# Patient Record
Sex: Male | Born: 1990 | Race: White | Hispanic: No | Marital: Single | Smoking: Never smoker
Health system: Southern US, Community
[De-identification: ages and names within clinical notes are randomized; demographics above are authoritative.]

## PROBLEM LIST (undated history)

## (undated) DIAGNOSIS — Z789 Other specified health status: Secondary | ICD-10-CM

## (undated) HISTORY — PX: NO PAST SURGERIES: SHX2092

---

## 2013-08-03 ENCOUNTER — Ambulatory Visit
Admission: RE | Admit: 2013-08-03 | Discharge: 2013-08-03 | Disposition: A | Discharge status: Home / Self Care | Payer: Self-pay | Source: Ambulatory Visit | Attending: Orthopedic Surgery | Admitting: Orthopedic Surgery

## 2013-08-03 ENCOUNTER — Other Ambulatory Visit: Payer: Self-pay | Admitting: Orthopedic Surgery

## 2013-08-03 ENCOUNTER — Inpatient Hospital Stay (HOSPITAL_COMMUNITY)
Admission: AD | Admit: 2013-08-03 | Discharge: 2013-08-13 | DRG: 853 | Disposition: A | Discharge status: Home Health | Payer: Managed Care, Other (non HMO) | Source: Ambulatory Visit | Attending: Internal Medicine | Admitting: Internal Medicine

## 2013-08-03 ENCOUNTER — Ambulatory Visit
Admission: RE | Admit: 2013-08-03 | Discharge: 2013-08-03 | Disposition: A | Discharge status: Home / Self Care | Payer: Managed Care, Other (non HMO) | Source: Ambulatory Visit | Attending: Orthopedic Surgery | Admitting: Orthopedic Surgery

## 2013-08-03 ENCOUNTER — Inpatient Hospital Stay (HOSPITAL_COMMUNITY): Payer: Managed Care, Other (non HMO)

## 2013-08-03 ENCOUNTER — Encounter (HOSPITAL_COMMUNITY): Payer: Self-pay | Admitting: Adult Health

## 2013-08-03 VITALS — Ht 70.0 in | Wt 155.6 lb

## 2013-08-03 DIAGNOSIS — R1900 Intra-abdominal and pelvic swelling, mass and lump, unspecified site: Secondary | ICD-10-CM

## 2013-08-03 DIAGNOSIS — R509 Fever, unspecified: Secondary | ICD-10-CM

## 2013-08-03 DIAGNOSIS — J869 Pyothorax without fistula: Secondary | ICD-10-CM | POA: Diagnosis present

## 2013-08-03 DIAGNOSIS — R52 Pain, unspecified: Secondary | ICD-10-CM | POA: Diagnosis present

## 2013-08-03 DIAGNOSIS — L02419 Cutaneous abscess of limb, unspecified: Secondary | ICD-10-CM | POA: Diagnosis present

## 2013-08-03 DIAGNOSIS — L02219 Cutaneous abscess of trunk, unspecified: Secondary | ICD-10-CM

## 2013-08-03 DIAGNOSIS — E871 Hypo-osmolality and hyponatremia: Secondary | ICD-10-CM | POA: Diagnosis present

## 2013-08-03 DIAGNOSIS — L03319 Cellulitis of trunk, unspecified: Secondary | ICD-10-CM

## 2013-08-03 DIAGNOSIS — R21 Rash and other nonspecific skin eruption: Secondary | ICD-10-CM | POA: Diagnosis present

## 2013-08-03 DIAGNOSIS — L0291 Cutaneous abscess, unspecified: Secondary | ICD-10-CM

## 2013-08-03 DIAGNOSIS — J9601 Acute respiratory failure with hypoxia: Secondary | ICD-10-CM | POA: Diagnosis not present

## 2013-08-03 DIAGNOSIS — E43 Unspecified severe protein-calorie malnutrition: Secondary | ICD-10-CM | POA: Diagnosis present

## 2013-08-03 DIAGNOSIS — A400 Sepsis due to streptococcus, group A: Secondary | ICD-10-CM

## 2013-08-03 DIAGNOSIS — L02213 Cutaneous abscess of chest wall: Secondary | ICD-10-CM | POA: Diagnosis present

## 2013-08-03 DIAGNOSIS — E876 Hypokalemia: Secondary | ICD-10-CM | POA: Diagnosis not present

## 2013-08-03 DIAGNOSIS — J80 Acute respiratory distress syndrome: Secondary | ICD-10-CM | POA: Diagnosis not present

## 2013-08-03 DIAGNOSIS — L03119 Cellulitis of unspecified part of limb: Secondary | ICD-10-CM

## 2013-08-03 DIAGNOSIS — J90 Pleural effusion, not elsewhere classified: Secondary | ICD-10-CM | POA: Diagnosis not present

## 2013-08-03 DIAGNOSIS — R222 Localized swelling, mass and lump, trunk: Secondary | ICD-10-CM

## 2013-08-03 DIAGNOSIS — A409 Streptococcal sepsis, unspecified: Principal | ICD-10-CM | POA: Diagnosis present

## 2013-08-03 DIAGNOSIS — D72829 Elevated white blood cell count, unspecified: Secondary | ICD-10-CM

## 2013-08-03 DIAGNOSIS — A419 Sepsis, unspecified organism: Secondary | ICD-10-CM | POA: Diagnosis present

## 2013-08-03 DIAGNOSIS — N5089 Other specified disorders of the male genital organs: Secondary | ICD-10-CM | POA: Diagnosis present

## 2013-08-03 DIAGNOSIS — M726 Necrotizing fasciitis: Secondary | ICD-10-CM | POA: Diagnosis present

## 2013-08-03 DIAGNOSIS — R6520 Severe sepsis without septic shock: Secondary | ICD-10-CM

## 2013-08-03 DIAGNOSIS — Z113 Encounter for screening for infections with a predominantly sexual mode of transmission: Secondary | ICD-10-CM

## 2013-08-03 DIAGNOSIS — J9600 Acute respiratory failure, unspecified whether with hypoxia or hypercapnia: Secondary | ICD-10-CM | POA: Diagnosis not present

## 2013-08-03 DIAGNOSIS — R6521 Severe sepsis with septic shock: Secondary | ICD-10-CM

## 2013-08-03 HISTORY — DX: Other specified health status: Z78.9

## 2013-08-03 LAB — CBC
HCT: 44.7 % (ref 39.0–52.0)
Hemoglobin: 16.2 g/dL (ref 13.0–17.0)
MCH: 31 pg (ref 26.0–34.0)
MCHC: 36.2 g/dL — AB (ref 30.0–36.0)
MCV: 85.6 fL (ref 78.0–100.0)
PLATELETS: 176 10*3/uL (ref 150–400)
RBC: 5.22 MIL/uL (ref 4.22–5.81)
RDW: 12.7 % (ref 11.5–15.5)
WBC: 21.4 10*3/uL — ABNORMAL HIGH (ref 4.0–10.5)

## 2013-08-03 LAB — MAGNESIUM: MAGNESIUM: 2.1 mg/dL (ref 1.5–2.5)

## 2013-08-03 LAB — COMPREHENSIVE METABOLIC PANEL
ALT: 15 U/L (ref 0–53)
AST: 39 U/L — AB (ref 0–37)
Albumin: 1.8 g/dL — ABNORMAL LOW (ref 3.5–5.2)
Alkaline Phosphatase: 68 U/L (ref 39–117)
BUN: 29 mg/dL — AB (ref 6–23)
CALCIUM: 7.8 mg/dL — AB (ref 8.4–10.5)
CO2: 18 mEq/L — ABNORMAL LOW (ref 19–32)
CREATININE: 0.98 mg/dL (ref 0.50–1.35)
Chloride: 85 mEq/L — ABNORMAL LOW (ref 96–112)
GFR calc Af Amer: >90 mL/min (ref >90)
GFR calc non Af Amer: >90 mL/min (ref >90)
Glucose, Bld: 122 mg/dL — ABNORMAL HIGH (ref 70–99)
Potassium: 4.1 mEq/L (ref 3.7–5.3)
Sodium: 123 mEq/L — ABNORMAL LOW (ref 137–147)
TOTAL PROTEIN: 5.6 g/dL — AB (ref 6.0–8.3)
Total Bilirubin: 2 mg/dL — ABNORMAL HIGH (ref 0.3–1.2)

## 2013-08-03 LAB — PHOSPHORUS: Phosphorus: 3.7 mg/dL (ref 2.3–4.6)

## 2013-08-03 LAB — LACTIC ACID, PLASMA
LACTIC ACID, VENOUS: 3.6 mmol/L — AB (ref 0.5–2.2)
Lactic Acid, Venous: 2.9 mmol/L — ABNORMAL HIGH (ref 0.5–2.2)

## 2013-08-03 LAB — PROTIME-INR
INR: 1.34 (ref 0.00–1.49)
PROTHROMBIN TIME: 16.3 s — AB (ref 11.6–15.2)

## 2013-08-03 LAB — MRSA PCR SCREENING: MRSA by PCR: NEGATIVE

## 2013-08-03 LAB — GLUCOSE, CAPILLARY
GLUCOSE-CAPILLARY: 91 mg/dL (ref 70–99)
GLUCOSE-CAPILLARY: 148 mg/dL — AB (ref 70–99)

## 2013-08-03 LAB — PROCALCITONIN: Procalcitonin: 27.51 ng/mL

## 2013-08-03 MED ORDER — PHENYLEPHRINE HCL 10 MG/ML IJ SOLN
20.0000 ug/min | INTRAMUSCULAR | Status: DC
  Administered 2013-08-03: 20 ug/min via INTRAVENOUS
  Administered 2013-08-04: 35 ug/min via INTRAVENOUS
  Filled 2013-08-03 (×2): qty 1

## 2013-08-03 MED ORDER — IOHEXOL 300 MG/ML  SOLN
30.0000 mL | Freq: Once | INTRAMUSCULAR | Status: AC | PRN
  Administered 2013-08-03: 30 mL via ORAL

## 2013-08-03 MED ORDER — PIPERACILLIN-TAZOBACTAM 3.375 G IVPB
3.3750 g | Freq: Three times a day (TID) | INTRAVENOUS | Status: DC
  Administered 2013-08-03 – 2013-08-05 (×5): 3.375 g via INTRAVENOUS
  Filled 2013-08-03 (×7): qty 50

## 2013-08-03 MED ORDER — SODIUM CHLORIDE 0.9 % IV BOLUS (SEPSIS)
1000.0000 mL | INTRAVENOUS | Status: AC | PRN
Start: 2013-08-03 — End: 2013-08-04
  Administered 2013-08-03 – 2013-08-04 (×6): 1000 mL via INTRAVENOUS

## 2013-08-03 MED ORDER — VANCOMYCIN HCL IN DEXTROSE 1-5 GM/200ML-% IV SOLN
1000.0000 mg | Freq: Once | INTRAVENOUS | Status: AC
  Administered 2013-08-03: 1000 mg via INTRAVENOUS
  Filled 2013-08-03: qty 200

## 2013-08-03 MED ORDER — NALOXONE HCL 0.4 MG/ML IJ SOLN: 0.4000 mg | INTRAMUSCULAR | Status: DC | PRN

## 2013-08-03 MED ORDER — ONDANSETRON HCL 4 MG/2ML IJ SOLN
4.0000 mg | Freq: Four times a day (QID) | INTRAMUSCULAR | Status: DC | PRN
Start: 2013-08-03 — End: 2013-08-05

## 2013-08-03 MED ORDER — CLINDAMYCIN PHOSPHATE 600 MG/50ML IV SOLN: 600.0000 mg | Freq: Four times a day (QID) | INTRAVENOUS | Status: DC

## 2013-08-03 MED ORDER — HYDROMORPHONE 0.3 MG/ML IV SOLN
INTRAVENOUS | Status: DC
  Administered 2013-08-03: 18:00:00 via INTRAVENOUS
  Administered 2013-08-03: 3.3 mg via INTRAVENOUS
  Administered 2013-08-04: 4.05 mg via INTRAVENOUS
  Administered 2013-08-04: 2.4 mg via INTRAVENOUS
  Administered 2013-08-04: 0.9 mg via INTRAVENOUS
  Administered 2013-08-04: 3.44 mg via INTRAVENOUS
  Administered 2013-08-04: 01:00:00 via INTRAVENOUS
  Administered 2013-08-05: 0.199 mg via INTRAVENOUS
  Administered 2013-08-05: 5.4 mg via INTRAVENOUS
  Filled 2013-08-03 (×4): qty 25

## 2013-08-03 MED ORDER — SODIUM CHLORIDE 0.9 % IV BOLUS (SEPSIS): 500.0000 mL | Freq: Once | INTRAVENOUS | Status: DC

## 2013-08-03 MED ORDER — SODIUM CHLORIDE 0.9 % IV SOLN
250.0000 mL | INTRAVENOUS | Status: DC | PRN
  Administered 2013-08-03 – 2013-08-06 (×2): 250 mL via INTRAVENOUS

## 2013-08-03 MED ORDER — ACETAMINOPHEN 325 MG PO TABS
650.0000 mg | ORAL_TABLET | ORAL | Status: DC | PRN
  Administered 2013-08-09 – 2013-08-12 (×6): 650 mg via ORAL
  Filled 2013-08-03 (×6): qty 2

## 2013-08-03 MED ORDER — HYDROCORTISONE SOD SUCCINATE 100 MG IJ SOLR
50.0000 mg | Freq: Four times a day (QID) | INTRAMUSCULAR | Status: DC
  Administered 2013-08-03 – 2013-08-05 (×6): 50 mg via INTRAVENOUS
  Filled 2013-08-03 (×10): qty 1

## 2013-08-03 MED ORDER — SODIUM CHLORIDE 0.9 % IJ SOLN: 9.0000 mL | INTRAMUSCULAR | Status: DC | PRN

## 2013-08-03 MED ORDER — IOHEXOL 300 MG/ML  SOLN
100.0000 mL | Freq: Once | INTRAMUSCULAR | Status: AC | PRN
  Administered 2013-08-03: 100 mL via INTRAVENOUS

## 2013-08-03 MED ORDER — VANCOMYCIN HCL IN DEXTROSE 1-5 GM/200ML-% IV SOLN
1000.0000 mg | Freq: Three times a day (TID) | INTRAVENOUS | Status: DC
  Administered 2013-08-04 – 2013-08-06 (×8): 1000 mg via INTRAVENOUS
  Filled 2013-08-03 (×10): qty 200

## 2013-08-03 MED ORDER — SODIUM CHLORIDE 0.9 % IV SOLN
INTRAVENOUS | Status: DC
  Administered 2013-08-03: 18:00:00 via INTRAVENOUS
  Administered 2013-08-03: 250 mL via INTRAVENOUS

## 2013-08-03 MED ORDER — SODIUM CHLORIDE 0.9 % IV SOLN
INTRAVENOUS | Status: DC
  Administered 2013-08-03 – 2013-08-10 (×2): via INTRAVENOUS

## 2013-08-03 MED ORDER — DIPHENHYDRAMINE HCL 50 MG/ML IJ SOLN
12.5000 mg | Freq: Four times a day (QID) | INTRAMUSCULAR | Status: DC | PRN
Start: 2013-08-03 — End: 2013-08-05

## 2013-08-03 MED ORDER — SODIUM CHLORIDE 0.9 % IV SOLN: INTRAVENOUS | Status: DC

## 2013-08-03 MED ORDER — SODIUM CHLORIDE 0.9 % IV SOLN: 250.0000 mL | INTRAVENOUS | Status: DC | PRN

## 2013-08-03 MED ORDER — DIPHENHYDRAMINE HCL 12.5 MG/5ML PO ELIX
12.5000 mg | ORAL_SOLUTION | Freq: Four times a day (QID) | ORAL | Status: DC | PRN
  Filled 2013-08-03: qty 5

## 2013-08-03 MED ORDER — ENOXAPARIN SODIUM 40 MG/0.4ML ~~LOC~~ SOLN
40.0000 mg | SUBCUTANEOUS | Status: DC
  Filled 2013-08-03: qty 0.4

## 2013-08-03 MED ORDER — CLINDAMYCIN PHOSPHATE 600 MG/50ML IV SOLN
600.0000 mg | Freq: Four times a day (QID) | INTRAVENOUS | Status: DC
  Administered 2013-08-03 – 2013-08-10 (×27): 600 mg via INTRAVENOUS
  Filled 2013-08-03 (×34): qty 50

## 2013-08-03 MED ORDER — FAMOTIDINE IN NACL 20-0.9 MG/50ML-% IV SOLN: 20.0000 mg | Freq: Two times a day (BID) | INTRAVENOUS | Status: DC

## 2013-08-03 MED ORDER — ASPIRIN 81 MG PO CHEW: 324.0000 mg | CHEWABLE_TABLET | ORAL | Status: DC

## 2013-08-03 MED ORDER — ASPIRIN 300 MG RE SUPP: 300.0000 mg | RECTAL | Status: DC

## 2013-08-03 NOTE — H&P (Signed)
Name: [name] [name] MRN: [id number] DOB: [birth year]    ADMISSION DATE:  08/03/2013  REFERRING MD :  Thurston Hole  PRIMARY SERVICE: PCCM  CHIEF COMPLAINT:  Abscess   BRIEF PATIENT DESCRIPTION: [name] [name] male with no PMH direct admit from outpt office 1/15 with large L chest wall abscess with gaseous formation.   SIGNIFICANT EVENTS / STUDIES:  CT abd/pelvis 1/15>>> 6.6x2.7cm L anterior chest wall abscess with marked edema throughout, gas formation and ?nec fasc   LINES / TUBES:   CULTURES: BCx2 1/15>>>  ANTIBIOTICS: Vanc 1/15>>> Zosyn 1/15>>> Clindamycin 1/15>>>  HISTORY OF PRESENT ILLNESS:  23yo male with 1 week hx L shoulder/chest pain, fevers.  Initially thought to be ?flu given fever and myalgia but swab was neg as outpt.  Ultimately sought care at orthopedic for ?pulled shoulder muscle.  L shoulder swelling and pain rapidly progressive.  CT chest showed large chest wall abscess with ?gas and pt was direct admit to ICU.  Pt is healthy, active.  Denies trauma, obvious skin break, recent sick contacts.  Tattoos are old, denies IV drug use, tobacco use.    PAST MEDICAL HISTORY :  No past medical history on file. No past surgical history on file. Prior to Admission medications   Not on File   No Known Allergies  FAMILY HISTORY:  No family history on file. SOCIAL HISTORY:  has no tobacco, alcohol, and drug history on file.  REVIEW OF SYSTEMS:  As per HPI.  All other systems reviewed and were neg.    VITAL SIGNS: Weight:  [155 lb 10.3 oz (70.6 kg)] 155 lb 10.3 oz (70.6 kg) (01/15 1500) HEMODYNAMICS:   VENTILATOR SETTINGS:   INTAKE / OUTPUT: Intake/Output   None     PHYSICAL EXAMINATION: General:  Young, wdwn male, uncomfortable appearing  Neuro:  Awake, alert, appropriate, MAE  HEENT:  Mm moist, no JVD  Cardiovascular:  s1s2 rrr, tachy  Lungs:  resps even non labored on RA, cta  Abdomen:  Soft, +bs  Musculoskeletal:  L chest wall/shoulder swelling, erythematous,  hot, tight   LABS:  CBC No results found for this basename: WBC, HGB, HCT, PLT,  in the last 168 hours Coag's No results found for this basename: APTT, INR,  in the last 168 hours BMET No results found for this basename: NA, K, CL, CO2, BUN, CREATININE, GLUCOSE,  in the last 168 hours Electrolytes No results found for this basename: CALCIUM, MG, PHOS,  in the last 168 hours Sepsis Markers No results found for this basename: LATICACIDVEN, PROCALCITON, O2SATVEN,  in the last 168 hours ABG No results found for this basename: PHART, PCO2ART, PO2ART,  in the last 168 hours Liver Enzymes No results found for this basename: AST, ALT, ALKPHOS, BILITOT, ALBUMIN,  in the last 168 hours Cardiac Enzymes No results found for this basename: TROPONINI, PROBNP,  in the last 168 hours Glucose No results found for this basename: GLUCAP,  in the last 168 hours  Imaging Ct Chest W Contrast  08/03/2013   CLINICAL DATA:  Increasing left upper chest pain and swelling for 4 days. Fever and nausea. Unable to raise left arm.  EXAM: CT CHEST and ABDOMEN WITHOUT CONTRAST  TECHNIQUE: Multidetector CT imaging of the chest and abdomen was performed following the standard protocol without IV contrast.  COMPARISON:  None available for comparison at time of study interpretation.  FINDINGS: CT CHEST FINDINGS  Markedly edematous left anterior chest wall, with edema within the pectoralis muscle, there is a  subpectoral as 6.6 x 2.7 cm fluid collection contiguous with the chest wall. Loss of the intramuscular fat planes. The interstitial edema extends posteriorly into the latissimus dorsi and anterior shoulder musculature. There is marked edema along the external oblique muscle. No subcutaneous gas. Mild inflammatory changes in and apparent thickening of the anterior left lung pleura, axial 39/106. Small left pleural effusion. No destructive bony lesions. Left subclavian vein appears patent though, not tailored for evaluation.   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 The heart is unremarkable. Equivocal anterior pericardial thickening. Small left greater than right pleural effusions without abscess, bibasilar atelectasis. Tracheobronchial tree is patent and midline. No pneumothorax.  CT ABDOMEN FINDINGS  The liver, spleen, pancreas, gallbladder and adrenal glands are unremarkable.  Stomach, included small and large bowel are normal in course and caliber without inflammatory changes. Normal appendix. No intraperitoneal free fluid or free air within the abdomen.  Kidneys are unremarkable. Great vessels are normal in course and caliber. Scattered thoracic Schmorl's nodes. 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  IMPRESSION: CT chest: 6.6 x 2.7 cm left anterior chest small abscess with markedly edema throughout the anterior lateral left chest wall, highly concerning for infectious process, early necrotizing fasciitis cannot be excluded.  Mild anterior left pleural thickening which may reflect reactive changes, and equivocal pericardial wall thickening, infectious involvement may have this appearance. Small pleural effusions and atelectasis.  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  CT abdomen:  No acute intra-abdominal process.  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  Critical Value/emergent results were called by telephone at the time of interpretation on 08/03/2013 at 3:05 PM to Genelle Bal, PA , who verbally acknowledged these results. Patient was instructed to return to Dr. Sherene Sires office per [facility] request.   Electronically Signed   By: Awilda Metro   On: 08/03/2013 15:27   Ct Abdomen W Contrast  08/03/2013   CLINICAL DATA:  Increasing left upper chest pain and swelling for 4 days. Fever and  nausea. Unable to raise left arm.  EXAM: CT CHEST and ABDOMEN WITHOUT CONTRAST  TECHNIQUE: Multidetector CT imaging of the chest and abdomen was performed following the standard protocol without IV contrast.  COMPARISON:  None available for comparison at time of study interpretation.  FINDINGS: CT CHEST FINDINGS  Markedly edematous left anterior chest wall, with edema within the pectoralis muscle, there is a subpectoral as 6.6 x 2.7 cm fluid collection contiguous with the chest wall. Loss of the intramuscular fat planes. The interstitial edema extends posteriorly into the latissimus dorsi and anterior shoulder musculature. There is marked edema along the external oblique muscle. No subcutaneous gas. Mild inflammatory changes in and apparent thickening of the anterior left lung pleura, axial 39/106. Small left pleural effusion. No destructive bony lesions. Left subclavian vein appears patent though, not tailored for evaluation.  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 The heart is unremarkable. Equivocal anterior pericardial thickening. Small left greater than right pleural effusions without abscess, bibasilar atelectasis. Tracheobronchial tree is patent and midline. No pneumothorax.  CT ABDOMEN FINDINGS  The liver, spleen, pancreas, gallbladder and adrenal glands are unremarkable.  Stomach, included small and large bowel are normal in course and caliber without inflammatory changes. Normal appendix. No intraperitoneal free fluid or free air within the abdomen.  Kidneys are  unremarkable. Great vessels are normal in course and caliber. Scattered thoracic Schmorl's nodes. 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  IMPRESSION: CT chest: 6.6 x 2.7 cm left anterior chest small abscess with markedly edema throughout the anterior lateral left chest wall, highly concerning for infectious  process, early necrotizing fasciitis cannot be excluded.  Mild anterior left pleural thickening which may reflect reactive changes, and equivocal pericardial wall thickening, infectious involvement may have this appearance. Small pleural effusions and atelectasis.  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  CT abdomen:  No acute intra-abdominal process.  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  Critical Value/emergent results were called by telephone at the time of interpretation on 08/03/2013 at 3:05 PM to Genelle BalKirsten Shepperson, PA , who verbally acknowledged these results. Patient was instructed to return to Dr. Sherene [facility]iresWainer's [facility].   Electronically Signed   By: Awilda Metroourtnay  Bloomer   On: 08/03/2013 15:27     ASSESSMENT / PLAN:   Large, L anterior chest wall abscess.  With gas formation and extensive soft tissue edema.  ??Nec fasc.  Unknown etiology. No known trauma or underlying hx.  PLAN -  Admit ICU for close monitoring with ??nec fasc  CVTS consulted - anticipate fairly urgent OR 1/15 with CVTS v gen surgery  Broad spectrum abx as above  Blood cultures  Abscess culture once drained  PCA analgesia  Consider check HIV  F/u labs  lovenox for DVT proph - start 1/16 given ??OR 1/15   I have personally obtained a history, examined the patient, evaluated laboratory and imaging results, formulated the assessment and plan and placed orders. CRITICAL CARE: The patient is critically ill with multiple organ systems failure and requires high complexity decision making for assessment and support, frequent evaluation and titration of therapies, application of advanced monitoring technologies and extensive interpretation of multiple databases. Critical Care Time devoted to patient care services described in this note is ____ minutes.    Danford BadWHITEHEART,KATHRYN, NP 08/03/2013  4:45 PM Pager: [phone] or  [phone]  *Care during the described time interval was provided by me and/or other providers on the critical care team. I have reviewed this patient's available data, including medical history, events of note, physical examination and test results as part of my evaluation.    PCCM ATTENDING: I have interviewed and examined the patient and reviewed the database. I have formulated the assessment and plan as reflected in the note above with amendments made by me.   Billy Fischeravid Simonds, MD;  PCCM service; Mobile [phone]

## 2013-08-03 NOTE — Procedures (Signed)
Central Venous Catheter Insertion Procedure Note Olam IdlerJoshua Tafolla [id number] 1990/10/14  Procedure: Insertion of Central Venous Catheter Indications: Assessment of intravascular volume, Drug and/or fluid administration and Frequent blood sampling  Procedure Details Consent: Risks of procedure as well as the alternatives and risks of each were explained to the (patient/caregiver).  Consent for procedure obtained.  Time Out: Verified patient identification, verified procedure, site/side was marked, verified correct patient position, special equipment/implants available, medications/allergies/relevent history reviewed, required imaging and test results available.  Performed  Maximum sterile technique was used including antiseptics, cap, gloves, gown, hand hygiene, mask and sheet.  Skin prep: Chlorhexidine; local anesthetic administered A antimicrobial bonded/coated triple lumen catheter was placed in the right internal jugular vein to 16 cm using the Seldinger technique.  Evaluation Blood flow good Complications: No apparent complications Patient did tolerate procedure well. Chest X-ray ordered to verify placement.  CXR: pending.   Procedure performed with ultrasound guidance for real time vessel cannulation.     Canary BrimBrandi Iyah Laguna, NP-C [facility] Pgr: [phone] or [phone]    08/03/2013, 9:30 PM

## 2013-08-03 NOTE — Consult Note (Signed)
[address]       [place] [address]             [phone]        [facility] Medical Record #[id number] Date of Birth: 05-31-1991  Referring: No ref. provider found Primary Care: Juliette Alcide, MD Chief complaint-pain and swelling of left anterior chest x5 days  History of Present Illness:     Patient examined, CTA chest and abdomen reviewed 23 year old previously healthy Caucasian male nonsmoker nondiabetic developed progressive swelling erythema tenderness and pain over left anterior chest without preceding causative event. The patient states he had some nausea and vomiting felt to be gastroenteritis 24 hours before this occurred. He denied any violent emesis. There is no history of spider bite or insect bite or any trauma at all. His initial discomfort was in the axilla. He is no previous history of serious infection diabetes liver disease or immune deficiency.  CT scan of the chest demonstrates significant swelling in the anterior chest wall with subpectoral fluid collection 3 x  4 cm. The left sternal clavicular joint is not destroyed or abnormal. There is no intrathoracic abnormality other than a small pleural effusion and some pleural thickening of the anterior chest. Great vessels are intact. Patient has a right sided aortic arch. No significant pericardial effusion   Current Activity/ Functional Status: Patient lives independently works for [employer] in [place]   Zubrod Score: At the time of surgery this patient's most appropriate activity status/level should be described as: []  Normal activity, no symptoms [x]  Symptoms, fully ambulatory []  Symptoms, in bed less than or equal to 50% of the time []  Symptoms, in bed greater than 50% of the time but less than 100% []  Bedridden []  Moribund  No past medical history on file.  No past surgical history on file.  History  Smoking status  . Not on file  Smokeless  tobacco  . Not on file    History  Alcohol Use: Not on file    History   Social History  . Marital Status: Single    Spouse Name: N/A    Number of Children: N/A  . Years of Education: N/A   Occupational History  . Not on file.   Social History Main Topics  . Smoking status: Not on file  . Smokeless tobacco: Not on file  . Alcohol Use: Not on file  . Drug Use: Not on file  . Sexual Activity: Not on file   Other Topics Concern  . Not on file   Social History Narrative  . No narrative on file    No Known Allergies  Current Facility-Administered Medications  Medication Dose Route Frequency Provider Last Rate Last Dose  . 0.9 %  sodium chloride infusion  250 mL Intravenous PRN Bernadene Person, NP      . 0.9 %  sodium chloride infusion   Intravenous Continuous Bernadene Person, NP 10 mL/hr at 08/03/13 1745    . clindamycin (CLEOCIN) IVPB 600 mg  600 mg Intravenous Q6H Merwyn Katos, MD   600 mg at 08/03/13 1807  . diphenhydrAMINE (BENADRYL) injection 12.5 mg  12.5 mg Intravenous Q6H PRN Bernadene Person, NP       Or  . diphenhydrAMINE (BENADRYL) 12.5 MG/5ML elixir 12.5 mg  12.5 mg Oral Q6H PRN Bernadene Person, NP      . Melene Muller ON 08/04/2013] enoxaparin (LOVENOX) injection 40 mg  40 mg Subcutaneous Q24H Bernadene Person, NP      . HYDROmorphone (DILAUDID) PCA injection 0.3 mg/mL   Intravenous Q4H Bernadene Person, NP      . naloxone [facility]) injection 0.4 mg  0.4 mg Intravenous PRN Bernadene Person, NP       And  . sodium chloride 0.9 % injection 9 mL  9 mL Intravenous PRN Bernadene Person, NP      . ondansetron (ZOFRAN) injection 4 mg  4 mg Intravenous Q6H PRN Bernadene Person, NP      . piperacillin-tazobactam (ZOSYN) IVPB 3.375 g  3.375 g Intravenous Q8H Merwyn Katos, MD   3.375 g at 08/03/13 1827  . vancomycin (VANCOCIN) IVPB 1000 mg/200 mL premix  1,000 mg Intravenous Once Merwyn Katos, MD   1,000 mg at 08/03/13 1849    Facility-Administered Medications Ordered in Other Encounters  Medication Dose Route Frequency Provider Last Rate Last Dose  . 0.9 %  sodium chloride infusion  250 mL Intravenous PRN Nelda Bucks, MD      . 0.9 %  sodium chloride infusion   Intravenous Continuous Nelda Bucks, MD      . aspirin chewable tablet 324 mg  324 mg Oral NOW Nelda Bucks, MD       Or  . aspirin suppository 300 mg  300 mg Rectal NOW Nelda Bucks, MD      . famotidine (PEPCID) IVPB 20 mg  20 mg Intravenous Q12H Nelda Bucks, MD      . sodium chloride 0.9 % bolus 500 mL  500 mL Intravenous Once Nelda Bucks, MD        Prescriptions prior to admission  Medication Sig Dispense Refill  . TAMIFLU 75 MG capsule Take 75 mg by mouth 2 (two) times daily.        No family history on file. negative for diabetes   Review of Systems:     Cardiac Review of Systems: Y or N  Chest Pain [  yes   ]  Resting SOB [  no ] Exertional SOB  [ no ]  Orthopnea [ no ]   Pedal Edema [ no  ]    Palpitations [ no ] Syncope  [ no ]   Presyncope [no   ]  General Review of Systems: [Y] = yes [  ]=no Constitional: recent weight change [ no ]; anorexia [  ]; fatigue [ yes ]; nausea [ yes ]; night sweats [ no ]; fever [  ]; or chills [no  ]                                                               Dental: poor dentition[  ]; Last Dentist visit:   Eye : blurred vision [  ]; diplopia [   ]; vision changes [  ];  Amaurosis fugax[  ]; Resp: cough [  ];  wheezing[  ];  hemoptysis[  ]; shortness of breath[  ]; paroxysmal nocturnal dyspnea[  ]; dyspnea on exertion[  ]; or orthopnea[  ];  GI:  gallstones[  ], vomiting[  ];  dysphagia[  ]; melena[  ];  hematochezia [  ]; heartburn[  ];   Hx of  Colonoscopy[  ]; GU: kidney stones [  ]; hematuria[  ];   dysuria [  ];  nocturia[  ];  history of     obstruction [  ]; urinary frequency [  ]             Skin: rash, swelling[  ];, hair loss[  ];  peripheral edema[  ];   or itching[  ]; Musculosketetal: myalgias[  ];  joint swelling[  ];  joint erythema[  ];  joint pain[  ];  back pain[  ];  Heme/Lymph: bruising[  ];  bleeding[  ];  anemia[  ];  Neuro: TIA[  ];  headaches[  ];  stroke[  ];  vertigo[  ];  seizures[  ];   paresthesias[  ];  difficulty walking[  ];  Psych:depression[  ]; anxiety[  ];  Endocrine: diabetes[  ];  thyroid dysfunction[  ];  Immunizations: Flu [  ]; Pneumococcal[  ];  Other:  Physical Exam: BP 105/57  Pulse 144  Resp 17  SpO2 94%  Gen. appearance-23 year old Caucasian male in bed responsive but uncomfortable HEENT normocephalic pupils equal dentition good Neck without swelling crepitus or pain Thorax with generalized erythema and swelling from the left clavicle to the left sub-pectoral region and left oblique region. No crepitus appreciated. Left axilla appears to be without evidence of hidradenitis. No mediastinal crunch Cardiac tachycardia but no gallop or murmur Abdomen benign Extremities without clubbing petechiae Fascia were palpable pulses in all extremities Neuro good grip bilaterally no focal motor deficit patient appropriate and oriented   Diagnostic Studies & Laboratory data:   CT scan reviewed with fluid collection underneath the pectoralis muscle group anterior to the chest wall close to the sternum at approximately the third  interspace  Recent Radiology Findings:   Ct Chest W Contrast  08/03/2013   CLINICAL DATA:  Increasing left upper chest pain and swelling for 4 days. Fever and nausea. Unable to raise left arm.  EXAM: CT CHEST and ABDOMEN WITHOUT CONTRAST  TECHNIQUE: Multidetector CT imaging of the chest and abdomen was performed following the standard protocol without IV contrast.  COMPARISON:  None available for comparison at time of study interpretation.  FINDINGS: CT CHEST FINDINGS  Markedly edematous left anterior chest wall, with edema within the pectoralis muscle, there is a subpectoral as 6.6 x 2.7 cm  fluid collection contiguous with the chest wall. Loss of the intramuscular fat planes. The interstitial edema extends posteriorly into the latissimus dorsi and anterior shoulder musculature. There is marked edema along the external oblique muscle. No subcutaneous gas. Mild inflammatory changes in and apparent thickening of the anterior left lung pleura, axial 39/106. Small left pleural effusion. No destructive bony lesions. Left subclavian vein appears patent though, not tailored for evaluation.  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 The heart is unremarkable. Equivocal anterior pericardial thickening. Small left greater than right pleural effusions without abscess, bibasilar atelectasis. Tracheobronchial tree is patent and midline. No pneumothorax.  CT ABDOMEN FINDINGS  The liver, spleen, pancreas, gallbladder and adrenal glands are unremarkable.  Stomach, included small and large bowel are normal in course and caliber without inflammatory changes. Normal appendix. No intraperitoneal free fluid or free air within the abdomen.  Kidneys are unremarkable. Great vessels are normal in course and caliber. Scattered thoracic Schmorl's nodes. 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  IMPRESSION: CT chest: 6.6 x 2.7 cm left  anterior chest small abscess with markedly edema throughout the anterior lateral left chest wall, highly concerning for infectious process, early necrotizing fasciitis cannot be excluded.  Mild anterior left pleural thickening which may reflect reactive changes, and equivocal pericardial wall thickening, infectious involvement may have this appearance. Small pleural effusions and atelectasis.  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  CT abdomen:  No acute intra-abdominal process.  Grade 1 L5-S1 anterolisthesis with  bilateral L5 pars interarticularis defects.  Critical Value/emergent results were called by telephone at the time of interpretation on 08/03/2013 at 3:05 PM to Genelle BalKirsten Shepperson, PA , who verbally acknowledged these results. Patient was instructed to return to Dr. Sherene SiresWainer's office [facility].   Electronically Signed   By: Awilda Metroourtnay  Bloomer   On: 08/03/2013 15:27   Ct Abdomen W Contrast  08/03/2013   CLINICAL DATA:  Increasing left upper chest pain and swelling for 4 days. Fever and nausea. Unable to raise left arm.  EXAM: CT CHEST and ABDOMEN WITHOUT CONTRAST  TECHNIQUE: Multidetector CT imaging of the chest and abdomen was performed following the standard protocol without IV contrast.  COMPARISON:  None available for comparison at time of study interpretation.  FINDINGS: CT CHEST FINDINGS  Markedly edematous left anterior chest wall, with edema within the pectoralis muscle, there is a subpectoral as 6.6 x 2.7 cm fluid collection contiguous with the chest wall. Loss of the intramuscular fat planes. The interstitial edema extends posteriorly into the latissimus dorsi and anterior shoulder musculature. There is marked edema along the external oblique muscle. No subcutaneous gas. Mild inflammatory changes in and apparent thickening of the anterior left lung pleura, axial 39/106. Small left pleural effusion. No destructive bony lesions. Left subclavian vein appears patent though, not tailored for evaluation.  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 The heart is unremarkable. Equivocal anterior pericardial thickening. Small left greater than right pleural effusions without abscess, bibasilar atelectasis. Tracheobronchial tree is patent and midline. No pneumothorax.  CT ABDOMEN FINDINGS  The liver, spleen, pancreas, gallbladder and adrenal glands are unremarkable.  Stomach, included small and large bowel are normal in  course and caliber without inflammatory changes. Normal appendix. No intraperitoneal free fluid or free air within the abdomen.  Kidneys are unremarkable. Great vessels are normal in course and caliber. Scattered thoracic Schmorl's nodes. 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  IMPRESSION: CT chest: 6.6 x 2.7 cm left anterior chest small abscess with markedly edema throughout the anterior lateral left chest wall, highly concerning for infectious process, early necrotizing fasciitis cannot be excluded.  Mild anterior left pleural thickening which may reflect reactive changes, and equivocal pericardial wall thickening, infectious involvement may have this appearance. Small pleural effusions and atelectasis.  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  CT abdomen:  No acute intra-abdominal process.  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  Critical Value/emergent results were called by telephone at the time of interpretation on 08/03/2013 at 3:05 PM to Genelle BalKirsten Shepperson, PA , who verbally acknowledged these results. Patient was instructed to return to Dr. Sherene SiresWainer's office [facility].   Electronically Signed   By: Awilda Metroourtnay  Bloomer   On: 08/03/2013 15:27      Recent Lab Findings: Lab Results  Component Value Date   WBC 21.4* 08/03/2013   HGB 16.2 08/03/2013   HCT 44.7  08/03/2013   PLT 176 08/03/2013   GLUCOSE 122* 08/03/2013   ALT 15 08/03/2013   AST 39* 08/03/2013   NA 123* 08/03/2013   K 4.1 08/03/2013   CL 85* 08/03/2013   CREATININE 0.98 08/03/2013   BUN 29* 08/03/2013   CO2 18* 08/03/2013   INR 1.34 08/03/2013      Assessment / Plan:     Soft tissue infection of left anterior chest wall extending to the anterior ribs with a fluid collection just lateral to the sternal border at approximately the third interspace. Left axilla without evidence of  hidradenitis. Left sternoclavicular head without changes of osteo- myelitis. Skin without evidence of spider bite or other trauma penetration.   We'll plan on drainage and debridement of anterior chest with placement of wound VAC in a.m. Procedure, indications, postop care reviewed with patient and family. They understand we'll use general anesthesia and had a wound VAC system in place which will require dressing changes Monday Wednesday Friday.    @ME1 @ 08/03/2013 7:27 PM

## 2013-08-03 NOTE — Progress Notes (Addendum)
ANTIBIOTIC CONSULT NOTE - INITIAL  Pharmacy Consult for vancomycin and zosyn Indication: large L chest wall abscess with gaseous formation  No Known Allergies  Patient Measurements:   Adjusted Body Weight:   Vital Signs:   Intake/Output from previous day:   Intake/Output from this shift:    Labs: No results found for this basename: WBC, HGB, PLT, LABCREA, CREATININE,  in the last 72 hours CrCl is unknown because no creatinine reading has been taken. No results found for this basename: VANCOTROUGH, VANCOPEAK, VANCORANDOM, GENTTROUGH, GENTPEAK, GENTRANDOM, TOBRATROUGH, TOBRAPEAK, TOBRARND, AMIKACINPEAK, AMIKACINTROU, AMIKACIN,  in the last 72 hours   Microbiology: No results found for this or any previous visit (from the past 720 hour(s)).  Medical History: No past medical history on file.  Medications:  Scheduled:  . [START ON 08/04/2013] enoxaparin (LOVENOX) injection  40 mg Subcutaneous Q24H  . HYDROmorphone PCA 0.3 mg/mL   Intravenous Q4H   Infusions:  . sodium chloride     Assessment: 23 yo male with no past medical history will be started on vancomycin and zosyn for large L chest wall abscess with gaseous formation.  Patient is also on clindamycin. WBC 21.4.  SCr 0.98 (CrCl >100)   Goal of Therapy:  Vancomycin trough level 15-20 mcg/ml  Plan:  1) Zosyn 3.375g iv q8h (4h infusion) 2) Vancomycin 1g iv x1 now and then 1g iv q8h 3) Monitor renal function and plan on antibiotic before checking vancomycin trough  Raymie Giammarco, Tsz-Yin 08/03/2013,4:50 PM

## 2013-08-03 NOTE — Progress Notes (Deleted)
eLink Physician-Brief Progress Note Patient Name: [name] [name] DOB: 1991-02-03 MRN: [id number]  Date of Service  08/03/2013   HPI/Events of Note   Very very low k 2 in OR, 2 runs given  eICU Interventions  Treat 4 more runs, draw labs pre runs   Intervention Category Major Interventions: Electrolyte abnormality - evaluation and management  Madysin Crisp J. 08/03/2013, 4:45 PM

## 2013-08-03 NOTE — Progress Notes (Addendum)
Called to bedside to assess patient for hypotension, tachycardia.    S:  Patient reports "feeling tired / terrible"  O: Filed Vitals:   08/03/13 1920 08/03/13 1930 08/03/13 1940 08/03/13 1950  BP: 109/57 108/51 100/56 114/65  Pulse: 130 140 137 127  TempSrc:      Resp: 23 13 14 17   SpO2: 94% 95% 97% 97%   LABS:  Lactic acid: 3.6    Recent Labs Lab 08/03/13 1840  NA 123*  K 4.1  CL 85*  CO2 18*  GLUCOSE 122*  BUN 29*  CREATININE 0.98  CALCIUM 7.8*  MG 2.1  PHOS 3.7    Recent Labs Lab 08/03/13 1840  HGB 16.2  HCT 44.7  WBC 21.4*  PLT 176   Exam: General: wdwn young adult male, appears critically ill Neuro: AAOx4, speech clear, MAE CV: s1s2 tachy PULM: mild tachypnea, non-labored, lungs clear bilaterally GI: round/soft, bsx4 hypoactive Extremities: warm/dry.  Chest wall erythematous, warm, edematous on L from clavicle to left below rib area.  Erythema has moved past midline & prior markings on chest wall.  They have advanced to the 'g' in strength on his tattoo   A: Left Anterior Chest Wall Abscess Hypotension Tachycardia Severe Sepsis  Pain   P: -place central line, pcxr post for placement  -early goal directed therapy / aggressive volume resuscitation  -now 2L NS -repeat lactic acid post 2L -check cortisol, then stress steroids -continue clinda, zosyn & vanco -neosynephrine for MAP >65 -dilaudid PCA for pain -assess CVP Q4 -pending surgical debridement per CVTS in am    CC Time:  Additional 35 minutes separate from procedure time.  Family updated at bedside.   Canary BrimBrandi Olivine Hiers, NP-C  Pulmonary & Critical Care Pgr: [phone] or [phone]

## 2013-08-03 NOTE — Progress Notes (Signed)
eLink Physician-Brief Progress Note Patient Name: [name] IdlerJoshua [name] DOB: 1991-02-06 MRN: [id number]  Date of Service  08/03/2013   HPI/Events of Note   EGDT note / sepsis  Source chest wall , surgeon plan is OR in am  Lactic acid less 4 , volume depletion an issue Pt in no distress no pressors Will continue aggressive abx, increase volume aggressive Repeat lactic, place cvp If pressors requires despite volume and if lactic rises will  re discuss with surgery to perform earlier Family and pt updated Cortisol, then stress roids    eICU Interventions        Nelda BucksFEINSTEIN,Deberah Adolf J. 08/03/2013, 10:08 PM

## 2013-08-04 ENCOUNTER — Encounter (HOSPITAL_COMMUNITY): Payer: Self-pay | Admitting: *Deleted

## 2013-08-04 ENCOUNTER — Encounter (HOSPITAL_COMMUNITY)
Admission: AD | Disposition: A | Discharge status: Home Health | Payer: Self-pay | Source: Ambulatory Visit | Attending: Pulmonary Disease

## 2013-08-04 ENCOUNTER — Encounter (HOSPITAL_COMMUNITY): Payer: Managed Care, Other (non HMO) | Admitting: Anesthesiology

## 2013-08-04 ENCOUNTER — Inpatient Hospital Stay (HOSPITAL_COMMUNITY): Payer: Managed Care, Other (non HMO)

## 2013-08-04 ENCOUNTER — Inpatient Hospital Stay (HOSPITAL_COMMUNITY): Payer: Managed Care, Other (non HMO) | Admitting: Anesthesiology

## 2013-08-04 DIAGNOSIS — A419 Sepsis, unspecified organism: Secondary | ICD-10-CM

## 2013-08-04 DIAGNOSIS — L03319 Cellulitis of trunk, unspecified: Secondary | ICD-10-CM

## 2013-08-04 DIAGNOSIS — M726 Necrotizing fasciitis: Secondary | ICD-10-CM

## 2013-08-04 DIAGNOSIS — R6521 Severe sepsis with septic shock: Secondary | ICD-10-CM

## 2013-08-04 DIAGNOSIS — R6520 Severe sepsis without septic shock: Secondary | ICD-10-CM

## 2013-08-04 DIAGNOSIS — L02219 Cutaneous abscess of trunk, unspecified: Secondary | ICD-10-CM

## 2013-08-04 HISTORY — PX: APPLICATION OF WOUND VAC: SHX5189

## 2013-08-04 HISTORY — PX: STERNAL WOUND DEBRIDEMENT: SHX1058

## 2013-08-04 LAB — CBC
HEMATOCRIT: 37 % — AB (ref 39.0–52.0)
HEMOGLOBIN: 12.9 g/dL — AB (ref 13.0–17.0)
MCH: 30.3 pg (ref 26.0–34.0)
MCHC: 34.9 g/dL (ref 30.0–36.0)
MCV: 86.9 fL (ref 78.0–100.0)
Platelets: 140 10*3/uL — ABNORMAL LOW (ref 150–400)
RBC: 4.26 MIL/uL (ref 4.22–5.81)
RDW: 12.9 % (ref 11.5–15.5)
WBC: 18.7 10*3/uL — ABNORMAL HIGH (ref 4.0–10.5)

## 2013-08-04 LAB — URINALYSIS, ROUTINE W REFLEX MICROSCOPIC
Glucose, UA: NEGATIVE mg/dL
Ketones, ur: NEGATIVE mg/dL
Leukocytes, UA: NEGATIVE
Nitrite: NEGATIVE
Protein, ur: 30 mg/dL — AB
Specific Gravity, Urine: >1.046 — ABNORMAL HIGH (ref 1.005–1.030)
Urobilinogen, UA: 2 mg/dL — ABNORMAL HIGH (ref 0.0–1.0)
pH: 5.5 (ref 5.0–8.0)

## 2013-08-04 LAB — BASIC METABOLIC PANEL
BUN: 29 mg/dL — AB (ref 6–23)
CALCIUM: 6.5 mg/dL — AB (ref 8.4–10.5)
CO2: 19 mEq/L (ref 19–32)
Chloride: 100 mEq/L (ref 96–112)
Creatinine, Ser: 1.17 mg/dL (ref 0.50–1.35)
GFR calc non Af Amer: 89 mL/min — ABNORMAL LOW (ref >90)
GLUCOSE: 131 mg/dL — AB (ref 70–99)
POTASSIUM: 3.9 meq/L (ref 3.7–5.3)
Sodium: 132 mEq/L — ABNORMAL LOW (ref 137–147)

## 2013-08-04 LAB — TYPE AND SCREEN
ABO/RH(D): O POS
Antibody Screen: NEGATIVE

## 2013-08-04 LAB — GRAM STAIN

## 2013-08-04 LAB — URINE MICROSCOPIC-ADD ON

## 2013-08-04 LAB — CARBOXYHEMOGLOBIN
CARBOXYHEMOGLOBIN: 1.3 % (ref 0.5–1.5)
METHEMOGLOBIN: 1.3 % (ref 0.0–1.5)
O2 SAT: 77 %
Total hemoglobin: 14.9 g/dL (ref 13.5–18.0)

## 2013-08-04 LAB — ABO/RH: ABO/RH(D): O POS

## 2013-08-04 LAB — HIV ANTIBODY (ROUTINE TESTING W REFLEX): HIV: NONREACTIVE

## 2013-08-04 LAB — LACTIC ACID, PLASMA: Lactic Acid, Venous: 3.3 mmol/L — ABNORMAL HIGH (ref 0.5–2.2)

## 2013-08-04 LAB — CORTISOL: CORTISOL PLASMA: 27.8 ug/dL

## 2013-08-04 SURGERY — DEBRIDEMENT, WOUND, STERNUM
Anesthesia: General | Site: Chest

## 2013-08-04 MED ORDER — LACTATED RINGERS IV BOLUS (SEPSIS)
500.0000 mL | Freq: Once | INTRAVENOUS | Status: AC
  Administered 2013-08-04: 500 mL via INTRAVENOUS

## 2013-08-04 MED ORDER — ALBUMIN HUMAN 5 % IV SOLN
12.5000 g | Freq: Once | INTRAVENOUS | Status: AC
  Administered 2013-08-04: 12.5 g via INTRAVENOUS

## 2013-08-04 MED ORDER — GLYCOPYRROLATE 0.2 MG/ML IJ SOLN
INTRAMUSCULAR | Status: DC | PRN
  Administered 2013-08-04: .7 mg via INTRAVENOUS

## 2013-08-04 MED ORDER — PANTOPRAZOLE SODIUM 40 MG IV SOLR
40.0000 mg | INTRAVENOUS | Status: DC
  Administered 2013-08-04 – 2013-08-05 (×2): 40 mg via INTRAVENOUS
  Filled 2013-08-04 (×4): qty 40

## 2013-08-04 MED ORDER — MIDAZOLAM HCL 5 MG/5ML IJ SOLN
INTRAMUSCULAR | Status: DC | PRN
  Administered 2013-08-04: 1 mg via INTRAVENOUS

## 2013-08-04 MED ORDER — NEOSTIGMINE METHYLSULFATE 1 MG/ML IJ SOLN
INTRAMUSCULAR | Status: DC | PRN
  Administered 2013-08-04: 4 mg via INTRAVENOUS

## 2013-08-04 MED ORDER — ALBUMIN HUMAN 5 % IV SOLN
INTRAVENOUS | Status: AC
Start: 2013-08-04 — End: 2013-08-04
  Filled 2013-08-04: qty 250

## 2013-08-04 MED ORDER — LACTATED RINGERS IV SOLN
INTRAVENOUS | Status: DC | PRN
  Administered 2013-08-04: 09:00:00 via INTRAVENOUS

## 2013-08-04 MED ORDER — SODIUM CHLORIDE 0.9 % IV BOLUS (SEPSIS)
1000.0000 mL | Freq: Once | INTRAVENOUS | Status: AC
  Administered 2013-08-04: 1000 mL via INTRAVENOUS

## 2013-08-04 MED ORDER — HYDROMORPHONE HCL PF 1 MG/ML IJ SOLN: 0.2500 mg | INTRAMUSCULAR | Status: DC | PRN

## 2013-08-04 MED ORDER — DEXTROSE 5 % IV SOLN
10.0000 mg | INTRAVENOUS | Status: DC | PRN
  Administered 2013-08-04: 100 ug/min via INTRAVENOUS

## 2013-08-04 MED ORDER — PHENYLEPHRINE HCL 10 MG/ML IJ SOLN
20.0000 ug/min | INTRAVENOUS | Status: DC
  Administered 2013-08-04: 100 ug/min via INTRAVENOUS
  Filled 2013-08-04: qty 4

## 2013-08-04 MED ORDER — VANCOMYCIN HCL 1000 MG IV SOLR
INTRAVENOUS | Status: DC | PRN
  Administered 2013-08-04: 10:00:00

## 2013-08-04 MED ORDER — FENTANYL CITRATE 0.05 MG/ML IJ SOLN
INTRAMUSCULAR | Status: DC | PRN
  Administered 2013-08-04: 50 ug via INTRAVENOUS
  Administered 2013-08-04: 150 ug via INTRAVENOUS

## 2013-08-04 MED ORDER — PROPOFOL 10 MG/ML IV BOLUS
INTRAVENOUS | Status: DC | PRN
  Administered 2013-08-04: 180 mg via INTRAVENOUS

## 2013-08-04 MED ORDER — VANCOMYCIN HCL 1000 MG IV SOLR
INTRAVENOUS | Status: AC
  Filled 2013-08-04: qty 1000

## 2013-08-04 MED ORDER — SODIUM CHLORIDE 0.9 % IR SOLN
Status: DC | PRN
  Administered 2013-08-04: 2000 mL

## 2013-08-04 MED ORDER — ROCURONIUM BROMIDE 100 MG/10ML IV SOLN
INTRAVENOUS | Status: DC | PRN
  Administered 2013-08-04: 40 mg via INTRAVENOUS

## 2013-08-04 MED ORDER — ENSURE COMPLETE PO LIQD
237.0000 mL | Freq: Three times a day (TID) | ORAL | Status: DC
  Administered 2013-08-04 – 2013-08-07 (×6): 237 mL via ORAL
  Filled 2013-08-04 (×2): qty 237

## 2013-08-04 MED ORDER — ALBUMIN HUMAN 5 % IV SOLN
INTRAVENOUS | Status: DC | PRN
  Administered 2013-08-04: 10:00:00 via INTRAVENOUS

## 2013-08-04 MED ORDER — ONDANSETRON HCL 4 MG/2ML IJ SOLN
INTRAMUSCULAR | Status: DC | PRN
  Administered 2013-08-04: 4 mg via INTRAVENOUS

## 2013-08-04 MED ORDER — SODIUM CHLORIDE 0.9 % IJ SOLN
INTRAMUSCULAR | Status: AC
  Filled 2013-08-04: qty 10

## 2013-08-04 MED ORDER — NOREPINEPHRINE BITARTRATE 1 MG/ML IJ SOLN
2.0000 ug/min | INTRAVENOUS | Status: DC
  Administered 2013-08-04 (×2): 20 ug/min via INTRAVENOUS
  Administered 2013-08-04: 18 ug/min via INTRAVENOUS
  Administered 2013-08-04: 10 ug/min via INTRAVENOUS
  Filled 2013-08-04 (×4): qty 4

## 2013-08-04 MED ORDER — ENOXAPARIN SODIUM 40 MG/0.4ML ~~LOC~~ SOLN
40.0000 mg | SUBCUTANEOUS | Status: DC
  Administered 2013-08-05: 40 mg via SUBCUTANEOUS
  Filled 2013-08-04 (×2): qty 0.4

## 2013-08-04 MED ORDER — INFLUENZA VAC SPLIT QUAD 0.5 ML IM SUSP
0.5000 mL | INTRAMUSCULAR | Status: DC
  Filled 2013-08-04: qty 0.5

## 2013-08-04 MED ORDER — DEXTROSE 5 % IV SOLN
20.0000 ug/min | INTRAVENOUS | Status: DC
  Filled 2013-08-04: qty 2

## 2013-08-04 MED ORDER — LIDOCAINE HCL (CARDIAC) 20 MG/ML IV SOLN
INTRAVENOUS | Status: DC | PRN
  Administered 2013-08-04: 50 mg via INTRAVENOUS

## 2013-08-04 SURGICAL SUPPLY — 63 items
ATTRACTOMAT 16X20 MAGNETIC DRP (DRAPES) ×4 IMPLANT
BAG DECANTER FOR FLEXI CONT (MISCELLANEOUS) ×4 IMPLANT
BANDAGE GAUZE ELAST BULKY 4 IN (GAUZE/BANDAGES/DRESSINGS) IMPLANT
BENZOIN TINCTURE PRP APPL 2/3 (GAUZE/BANDAGES/DRESSINGS) IMPLANT
BLADE SURG 10 STRL SS (BLADE) ×4 IMPLANT
BLADE SURG 15 STRL LF DISP TIS (BLADE) IMPLANT
BLADE SURG 15 STRL SS (BLADE)
CANISTER SUCTION 2500CC (MISCELLANEOUS) ×8 IMPLANT
CANISTER WOUND CARE 500ML ATS (WOUND CARE) ×4 IMPLANT
CATH FOLEY 2WAY SLVR  5CC 16FR (CATHETERS)
CATH FOLEY 2WAY SLVR 5CC 16FR (CATHETERS) IMPLANT
CATH THORACIC 28FR RT ANG (CATHETERS) IMPLANT
CATH THORACIC 36FR (CATHETERS) IMPLANT
CATH THORACIC 36FR RT ANG (CATHETERS) IMPLANT
CLIP TI WIDE RED SMALL 24 (CLIP) IMPLANT
CONN Y 3/8X3/8X3/8  BEN (MISCELLANEOUS)
CONN Y 3/8X3/8X3/8 BEN (MISCELLANEOUS) IMPLANT
CONT SPEC 4OZ CLIKSEAL STRL BL (MISCELLANEOUS) ×4 IMPLANT
COVER SURGICAL LIGHT HANDLE (MISCELLANEOUS) ×12 IMPLANT
DRAPE LAPAROSCOPIC ABDOMINAL (DRAPES) ×8 IMPLANT
DRAPE WARM FLUID 44X44 (DRAPE) IMPLANT
DRSG AQUACEL AG ADV 3.5X14 (GAUZE/BANDAGES/DRESSINGS) IMPLANT
DRSG PAD ABDOMINAL 8X10 ST (GAUZE/BANDAGES/DRESSINGS) IMPLANT
DRSG VAC ATS MED SENSATRAC (GAUZE/BANDAGES/DRESSINGS) ×4 IMPLANT
ELECT REM PT RETURN 9FT ADLT (ELECTROSURGICAL) ×4
ELECTRODE REM PT RTRN 9FT ADLT (ELECTROSURGICAL) ×2 IMPLANT
GAUZE XEROFORM 5X9 LF (GAUZE/BANDAGES/DRESSINGS) IMPLANT
GLOVE BIO SURGEON STRL SZ7.5 (GLOVE) ×4 IMPLANT
GLOVE BIOGEL PI IND STRL 7.0 (GLOVE) ×2 IMPLANT
GLOVE BIOGEL PI INDICATOR 7.0 (GLOVE) ×2
GLOVE SURG SS PI 7.0 STRL IVOR (GLOVE) ×4 IMPLANT
GOWN STRL NON-REIN LRG LVL3 (GOWN DISPOSABLE) ×12 IMPLANT
HANDPIECE INTERPULSE COAX TIP (DISPOSABLE)
HEMOSTAT POWDER SURGIFOAM 1G (HEMOSTASIS) IMPLANT
HEMOSTAT SURGICEL 2X14 (HEMOSTASIS) IMPLANT
KIT BASIN OR (CUSTOM PROCEDURE TRAY) ×4 IMPLANT
KIT ROOM TURNOVER OR (KITS) ×4 IMPLANT
KIT SUCTION CATH 14FR (SUCTIONS) IMPLANT
NS IRRIG 1000ML POUR BTL (IV SOLUTION) ×8 IMPLANT
PACK CHEST (CUSTOM PROCEDURE TRAY) ×4 IMPLANT
PAD ARMBOARD 7.5X6 YLW CONV (MISCELLANEOUS) ×16 IMPLANT
PAD NEG PRESSURE SENSATRAC (MISCELLANEOUS) ×4 IMPLANT
SET HNDPC FAN SPRY TIP SCT (DISPOSABLE) IMPLANT
SOLUTION BETADINE 4OZ (MISCELLANEOUS) IMPLANT
SPONGE GAUZE 4X4 12PLY (GAUZE/BANDAGES/DRESSINGS) ×4 IMPLANT
SPONGE LAP 18X18 X RAY DECT (DISPOSABLE) ×8 IMPLANT
STAPLER VISISTAT 35W (STAPLE) IMPLANT
STRAP MONTGOMERY 1.25X11-1/8 (MISCELLANEOUS) IMPLANT
SUT ETHILON 3 0 FSL (SUTURE) IMPLANT
SUT STEEL 6MS V (SUTURE) IMPLANT
SUT STEEL STERNAL CCS#1 18IN (SUTURE) IMPLANT
SUT STEEL SZ 6 DBL 3X14 BALL (SUTURE) IMPLANT
SUT VIC AB 1 CTX 36 (SUTURE)
SUT VIC AB 1 CTX36XBRD ANBCTR (SUTURE) IMPLANT
SUT VIC AB 2-0 CTX 27 (SUTURE) IMPLANT
SUT VIC AB 3-0 X1 27 (SUTURE) IMPLANT
SWAB COLLECTION DEVICE MRSA (MISCELLANEOUS) ×4 IMPLANT
SYR 5ML LL (SYRINGE) IMPLANT
TOWEL OR 17X24 6PK STRL BLUE (TOWEL DISPOSABLE) ×8 IMPLANT
TOWEL OR 17X26 10 PK STRL BLUE (TOWEL DISPOSABLE) ×8 IMPLANT
TRAY FOLEY CATH 14FRSI W/METER (CATHETERS) IMPLANT
TUBE ANAEROBIC SPECIMEN COL (MISCELLANEOUS) ×4 IMPLANT
WATER STERILE IRR 1000ML POUR (IV SOLUTION) ×8 IMPLANT

## 2013-08-04 NOTE — Progress Notes (Signed)
Name: [name] [name] MRN: [id number] DOB: 1991-04-26    ADMISSION DATE:  08/03/2013  REFERRING MD :  EDP  PRIMARY SERVICE: PCCM  CHIEF COMPLAINT:  Abscess   BRIEF PATIENT DESCRIPTION: 23 yo male without medical history admitted 1/15 with large L chest wall abscess with gaseous formation concerning for necrotizing fascitis.   SIGNIFICANT EVENTS / STUDIES:  1/15 CT abd/pelvis >>> 6.6x2.7cm L anterior chest wall abscess with marked edema throughout, gas formation and ?nec fasc   LINES / TUBES:  R IJ CVL 1/15 >>>  CULTURES: 1/15  Blood >>>  ANTIBIOTICS: Vanc 1/15 >>> Zosyn 1/15 >>>  Clindamycin 1/15 >>>  INTERVAL HISTORY: Patient is doing well this morning and having less pain with the pain medicine. He is still having tenderness in his chest wall. Started last Saturday and progressed. Some nausea with vomiting during the week but not now. Markings from last night rash has spread father than in all directions.   VITAL SIGNS: Temp:  [98.1 F (36.7 C)-101.4 F (38.6 C)] 98.1 F (36.7 C) (01/16 0400) Pulse Rate:  [93-154] 97 (01/16 0700) Resp:  [11-34] 16 (01/16 0700) BP: (59-127)/(36-98) 103/50 mmHg (01/16 0700) SpO2:  [91 %-100 %] 92 % (01/16 0700) Weight:  [155 lb 10.3 oz (70.6 kg)-170 lb 3.1 oz (77.2 kg)] 170 lb 3.1 oz (77.2 kg) (01/16 0245)  HEMODYNAMICS: CVP:  [0 mmHg-10 mmHg] 10 mmHg  VENTILATOR SETTINGS:   INTAKE / OUTPUT: Intake/Output     01/15 0701 - 01/16 0700 01/16 0701 - 01/17 0700   I.V. (mL/kg) 1244 (16.1)    IV Piggyback 8650    Total Intake(mL/kg) 9894 (128.2)    Urine (mL/kg/hr) 1125    Total Output 1125     Net +8769            PHYSICAL EXAMINATION: General:  comfortable appearing  Neuro:  Awake, alert, appropriate, MAE  HEENT:  Mm moist, no JVD  Cardiovascular:  s1s2 rrr, tachy  Lungs:  resps even non labored, CTA bilaterally, decreased left Abdomen:  Soft, +bs  Musculoskeletal:  L chest wall/shoulder swelling, erythematous, hot, tight, tender  to touch, markings from last night and expanded greatly down arm and across chest and down chest wall.   LABS:  CBC  Recent Labs Lab 08/03/13 1840 08/04/13 0415  WBC 21.4* 18.7*  HGB 16.2 12.9*  HCT 44.7 37.0*  PLT 176 140*   Coag's  Recent Labs Lab 08/03/13 1840  INR 1.34   BMET  Recent Labs Lab 08/03/13 1840 08/04/13 0415  NA 123* 132*  K 4.1 3.9  CL 85* 100  CO2 18* 19  BUN 29* 29*  CREATININE 0.98 1.17  GLUCOSE 122* 131*   Electrolytes  Recent Labs Lab 08/03/13 1840 08/04/13 0415  CALCIUM 7.8* 6.5*  MG 2.1  --   PHOS 3.7  --    Sepsis Markers  Recent Labs Lab 08/03/13 1840 08/03/13 2200  LATICACIDVEN 3.6* 2.9*  PROCALCITON 27.51  --    ABG No results found for this basename: PHART, PCO2ART, PO2ART,  in the last 168 hours  Liver Enzymes  Recent Labs Lab 08/03/13 1840  AST 39*  ALT 15  ALKPHOS 68  BILITOT 2.0*  ALBUMIN 1.8*   Cardiac Enzymes No results found for this basename: TROPONINI, PROBNP,  in the last 168 hours Glucose  Recent Labs Lab 08/03/13 1646 08/03/13 1903  GLUCAP 91 148*   CXR: 1/16 >>> No significant change from prior, lungs without acute pathology, density over  left chest consistent with abscess location.  ASSESSMENT / PLAN:  PULMONARY A:  No active issues.  P:   Goal Sat >92% Supplemental oxygen prn  CARDIOVASCULAR A:  Septic shock. P:  Goal MAP > 65 Neo-Symephrine gtt Trend lactate   RENAL A:   Mild hyponatremia. P:   Goal CVP 8-10 Trend BMP NS@75   GASTROINTESTINAL A:   Nutrition. GI Px. P:   Diet if able Protonix  HEMATOLOGIC A:   DVT Px P:  Trend CBC Heparin  INFECTIOUS A:   Chest wall abscess. Suspected ecrotizing fasciitis. P:   To OR this AM Await finding Abx as above  ENDOCRINE A:   Suspected adrenal insufficiency. P:   Stress dose steroids pending cortisol level  NEUROLOGIC A:   Pain. P:   Dilaudid PCA  Genella MechKOLLAR, ELIZABETH 08/04/2013  7:02 AM  I  have personally obtained history, examined patient, evaluated and interpreted laboratory and imaging results, reviewed medical records, formulated assessment / plan and placed orders.  CRITICAL CARE:  The patient is critically ill with multiple organ systems failure and requires high complexity decision making for assessment and support, frequent evaluation and titration of therapies, application of advanced monitoring technologies and extensive interpretation of multiple databases. Critical Care Time devoted to patient care services described in this note is 35 minutes.   Lonia FarberZUBELEVITSKIY, Sheika Coutts, MD Pulmonary and Critical Care Medicine [facility] Pager: [phone]  08/04/2013, 9:03 AM

## 2013-08-04 NOTE — Transfer of Care (Signed)
Immediate Anesthesia Transfer of Care Note  Patient: [name] [name]  Procedure(s) Performed: Procedure(s): CHEST WALL ABCESS DEBRIDEMENT (N/A) 2 Ply APPLICATION OF WOUND VAC (Left)  Patient Location: PACU  Anesthesia Type:General  Level of Consciousness: awake and alert   Airway & Oxygen Therapy: Patient Spontanous Breathing and Patient connected to nasal cannula oxygen  Post-op Assessment: Report given to PACU RN and Post -op Vital signs reviewed and stable  Post vital signs: Reviewed and stable  Complications: No apparent anesthesia complications

## 2013-08-04 NOTE — Progress Notes (Signed)
eLink Physician-Brief Progress Note Patient Name: [name] IdlerJoshua Cygan DOB: [birth year] MRN: [id number]  Date of Service  08/04/2013   HPI/Events of Note  EGDT update - Patient has received 6 liters of IVFs and CVP remains at 5.  BP on NEO gtt at 35 mcg is 102/43 (58) with HR of 113.  Lactate has decreased to 2.9 from 3.6.  COOX is 77.  Patient is alert, attempting to void, in no acute resp distress.   eICU Interventions  Plan: Continue with EGDT with fluids with goal CVP of 8 or greater. May need to place foley if patient unsuccessful in voiding Pressor support as needed. Monitor resp status.   Intervention Category Major Interventions: Sepsis - evaluation and management  DETERDING,ELIZABETH 08/04/2013, 3:05 AM

## 2013-08-04 NOTE — Progress Notes (Addendum)
INITIAL NUTRITION ASSESSMENT  DOCUMENTATION CODES Per approved criteria  -severe malnutrition in the context of acute illness/injury   INTERVENTION:  Ensure Complete PO TID, each supplement provides 350 kcal and 13 grams of protein.  NUTRITION DIAGNOSIS: Increased nutrient needs related to chest wall abscess with wound VAC as evidenced by estimated needs.   Goal: Intake to meet >90% of estimated nutrition needs.  Monitor:  PO intake, labs, weight trend.  Reason for Assessment: MST=3  23 y.o. male  Admitting Dx: large L chest wall abscess with gaseous formation  ASSESSMENT: 23 yo male with 1 week hx L shoulder/chest pain, fevers. Initially thought to be ?flu given fever and myalgia but swab was neg as outpt. Ultimately sought care at orthopedic for ?pulled shoulder muscle. L shoulder swelling and pain rapidly progressive. CT chest showed large chest wall abscess with ?gas and pt was direct admit to ICU. Pt is healthy, active. Denies trauma, obvious skin break, recent sick contacts. Tattoos are old, denies IV drug use, tobacco use.   S/P chest wall abscess debridement with application of a wound VAC in the OR earlier today. In OR found sub pectoral space above ribs with pus tracking from sternum to Left AC region.  Per discussion with patient and his mother, he has been eating very poorly for the past 6 days. He had a GI illness last weekend and lost from 160 lb to 148 lb in 2 days. Weight of 155 lb on admission demonstrates a 3% weight loss from usual weight in < 1 week.   Pt meets criteria for severe MALNUTRITION in the context of acute illness as evidenced by 3% weight loss within 1 week and intake <50% of estimated energy requirement for > 5 days.  Nutrition focused physical exam completed.  No muscle or subcutaneous fat depletion noticed.  Height: Ht Readings from Last 1 Encounters:  08/04/13 5\' 10"  (1.778 m)    Weight: Wt Readings from Last 1 Encounters:  08/04/13 170  lb 3.1 oz (77.2 kg)  08/03/13 155 lb (70.6 kg)  Ideal Body Weight: 75.5 kg  % Ideal Body Weight: 94%  Wt Readings from Last 10 Encounters:  08/04/13 170 lb 3.1 oz (77.2 kg)  08/04/13 170 lb 3.1 oz (77.2 kg)  08/03/13 155 lb 10.3 oz (70.6 kg)    Usual Body Weight: 160 lb  % Usual Body Weight: 97% (when compared to admission weight)  BMI:  22.2 (using admission weight)  Estimated Nutritional Needs: Kcal: 2300-2500 Protein: 120-140 gm Fluid: 2.3-2.5 L  Skin: wound VAC to surgical chest incision  Diet Order: Full Liquid  EDUCATION NEEDS: -Education needs addressed; discussed the need for increased protein and calories to support wound healing.   Intake/Output Summary (Last 24 hours) at 08/04/13 1342 Last data filed at 08/04/13 1200  Gross per 24 hour  Intake 11046.61 ml  Output   1675 ml  Net 9371.61 ml    Last BM: 1/14   Labs:   Recent Labs Lab 08/03/13 1840 08/04/13 0415  NA 123* 132*  K 4.1 3.9  CL 85* 100  CO2 18* 19  BUN 29* 29*  CREATININE 0.98 1.17  CALCIUM 7.8* 6.5*  MG 2.1  --   PHOS 3.7  --   GLUCOSE 122* 131*    CBG (last 3)   Recent Labs  08/03/13 1646 08/03/13 1903  GLUCAP 91 148*    Scheduled Meds: . albumin human      . clindamycin (CLEOCIN) IV  600 mg  Intravenous Q6H  . [START ON 08/05/2013] enoxaparin (LOVENOX) injection  40 mg Subcutaneous Q24H  . hydrocortisone sodium succinate  50 mg Intravenous Q6H  . HYDROmorphone PCA 0.3 mg/mL   Intravenous Q4H  . [START ON 08/05/2013] influenza vac split quadrivalent PF  0.5 mL Intramuscular Tomorrow-1000  . pantoprazole (PROTONIX) IV  40 mg Intravenous Q24H  . piperacillin-tazobactam (ZOSYN)  IV  3.375 g Intravenous Q8H  . sodium chloride  1,000 mL Intravenous Once  . vancomycin  1,000 mg Intravenous Q8H    Continuous Infusions: . sodium chloride 10 mL/hr at 08/04/13 0400  . sodium chloride 75 mL/hr at 08/03/13 2217  . norepinephrine (LEVOPHED) Adult infusion 10 mcg/min (08/04/13  1310)    Past Medical History  Diagnosis Date  . Medical history non-contributory     Past Surgical History  Procedure Laterality Date  . No past surgeries      Joaquin CourtsKimberly Harris, RD, LDN, CNSC Pager [phone] After Hours Pager [phone]

## 2013-08-04 NOTE — Anesthesia Preprocedure Evaluation (Addendum)
Anesthesia Evaluation  Patient identified by MRN, date of birth, ID band Patient awake    Reviewed: Allergy & Precautions, H&P , NPO status , Patient's Chart, lab work & pertinent test results  Airway Mallampati: II      Dental   Pulmonary          Cardiovascular  Chest wall abscess history noted.   Neuro/Psych    GI/Hepatic negative GI ROS, Neg liver ROS,   Endo/Other  negative endocrine ROS  Renal/GU negative Renal ROS     Musculoskeletal   Abdominal   Peds  Hematology   Anesthesia Other Findings   Reproductive/Obstetrics                          Anesthesia Physical Anesthesia Plan  ASA: II  Anesthesia Plan: General   Post-op Pain Management:    Induction: Intravenous  Airway Management Planned: Oral ETT  Additional Equipment:   Intra-op Plan:   Post-operative Plan: Possible Post-op intubation/ventilation  Informed Consent: I have reviewed the patients History and Physical, chart, labs and discussed the procedure including the risks, benefits and alternatives for the proposed anesthesia with the patient or authorized representative who has indicated his/her understanding and acceptance.   Dental advisory given  Plan Discussed with: CRNA and Anesthesiologist  Anesthesia Plan Comments:         Anesthesia Quick Evaluation

## 2013-08-04 NOTE — Progress Notes (Signed)
Dr. Donata ClayVan Trigt at bedside asked about goal for SBP, he states 90. Patient at 782, fluid bolus administer. Otherwise VSS, patient arrousable and no pain. Will continue to monitor.

## 2013-08-04 NOTE — Brief Op Note (Signed)
08/03/2013 - 08/04/2013  11:00 AM  PATIENT:  [name] IdlerJoshua [name]  23 y.o. male  PRE-OPERATIVE DIAGNOSIS:  chest wall abcess  POST-OPERATIVE DIAGNOSIS:  chest wall abcess  PROCEDURE:  Procedure(s): CHEST WALL ABCESS DEBRIDEMENT (N/A) 2 Ply APPLICATION OF WOUND VAC (Left)  Findings- sub pectoral space above ribs with pus tracking from sternum to Left  AC region   SURGEON:  Surgeon(s) and Role: Panel 1:    * Kerin PernaPeter [name], MD - Primary  Panel 2:    * Kerin PernaPeter [name], MD - Primary  PHYSICIAN ASSISTANT: 0  ASSISTANTS: none   ANESTHESIA:   general  EBL:  Total I/O In: 964.6 [I.V.:714.6; IV Piggyback:250] Out: 250 [Urine:250]  BLOOD ADMINISTERED:none  DRAINS: wound VAC   LOCAL MEDICATIONS USED:  NONE  SPECIMEN:  Excision  DISPOSITION OF SPECIMEN:  PATHOLOGY, microbiology  COUNTS:  YES  TOURNIQUET:  * No tourniquets in log *  DICTATION: .Dragon Dictation  PLAN OF CARE: Admit to inpatient   PATIENT DISPOSITION:  ICU - extubated and stable.   Delay start of Pharmacological VTE agent (>24hrs) due to surgical blood loss or risk of bleeding: yes

## 2013-08-04 NOTE — Anesthesia Procedure Notes (Signed)
Procedure Name: Intubation Date/Time: 08/04/2013 9:29 AM Performed by: Gwenyth AllegraADAMI, Jayliah Benett Pre-anesthesia Checklist: Patient identified, Timeout performed, Emergency Drugs available, Suction available and Patient being monitored Patient Re-evaluated:Patient Re-evaluated prior to inductionOxygen Delivery Method: Circle system utilized Preoxygenation: Pre-oxygenation with 100% oxygen Intubation Type: IV induction Ventilation: Mask ventilation without difficulty Laryngoscope Size: Mac and 4 Grade View: Grade I Tube type: Oral Tube size: 7.5 mm Number of attempts: 1 Airway Equipment and Method: Stylet Placement Confirmation: ETT inserted through vocal cords under direct vision,  breath sounds checked- equal and bilateral and positive ETCO2 Secured at: 21 cm Tube secured with: Tape Dental Injury: Teeth and Oropharynx as per pre-operative assessment

## 2013-08-04 NOTE — Progress Notes (Signed)
The patient was examined and preop studies reviewed. There has been no change from the prior exam and the patient is ready for surgery.   Plan I& D of L chest wall abscess and wound VAC on [name] [name] today

## 2013-08-04 NOTE — OR Nursing (Signed)
Late entry to place Wound Vac under LDA's.

## 2013-08-04 NOTE — Anesthesia Postprocedure Evaluation (Signed)
  Anesthesia Post-op Note  Patient: [name] [name]  Procedure(s) Performed: Procedure(s): CHEST WALL ABCESS DEBRIDEMENT (N/A) 2 Ply APPLICATION OF WOUND VAC (Left)  Patient Location: PACU  Anesthesia Type:General  Level of Consciousness: awake, alert  and oriented  Airway and Oxygen Therapy: Patient Spontanous Breathing  Post-op Pain: mild  Post-op Assessment: Post-op Vital signs reviewed, Patient's Cardiovascular Status Stable, Respiratory Function Stable, Patent Airway, No signs of Nausea or vomiting and Adequate PO intake  Post-op Vital Signs: Reviewed and stable  Complications: No apparent anesthesia complications

## 2013-08-05 ENCOUNTER — Inpatient Hospital Stay (HOSPITAL_COMMUNITY): Payer: Managed Care, Other (non HMO)

## 2013-08-05 DIAGNOSIS — J90 Pleural effusion, not elsewhere classified: Secondary | ICD-10-CM

## 2013-08-05 DIAGNOSIS — J9601 Acute respiratory failure with hypoxia: Secondary | ICD-10-CM | POA: Diagnosis not present

## 2013-08-05 DIAGNOSIS — L02219 Cutaneous abscess of trunk, unspecified: Secondary | ICD-10-CM

## 2013-08-05 DIAGNOSIS — J80 Acute respiratory distress syndrome: Secondary | ICD-10-CM | POA: Diagnosis not present

## 2013-08-05 DIAGNOSIS — L03319 Cellulitis of trunk, unspecified: Secondary | ICD-10-CM

## 2013-08-05 DIAGNOSIS — E43 Unspecified severe protein-calorie malnutrition: Secondary | ICD-10-CM | POA: Diagnosis present

## 2013-08-05 DIAGNOSIS — J9600 Acute respiratory failure, unspecified whether with hypoxia or hypercapnia: Secondary | ICD-10-CM

## 2013-08-05 DIAGNOSIS — R6510 Systemic inflammatory response syndrome (SIRS) of non-infectious origin without acute organ dysfunction: Secondary | ICD-10-CM

## 2013-08-05 DIAGNOSIS — J9589 Other postprocedural complications and disorders of respiratory system, not elsewhere classified: Secondary | ICD-10-CM

## 2013-08-05 LAB — CORTISOL-AM, BLOOD: Cortisol - AM: 37.1 ug/dL — ABNORMAL HIGH (ref 4.3–22.4)

## 2013-08-05 LAB — CBC
HCT: 32.4 % — ABNORMAL LOW (ref 39.0–52.0)
Hemoglobin: 11.2 g/dL — ABNORMAL LOW (ref 13.0–17.0)
MCH: 29.8 pg (ref 26.0–34.0)
MCHC: 34.6 g/dL (ref 30.0–36.0)
MCV: 86.2 fL (ref 78.0–100.0)
Platelets: 142 10*3/uL — ABNORMAL LOW (ref 150–400)
RBC: 3.76 MIL/uL — ABNORMAL LOW (ref 4.22–5.81)
RDW: 13.1 % (ref 11.5–15.5)
WBC: 29.6 10*3/uL — ABNORMAL HIGH (ref 4.0–10.5)

## 2013-08-05 LAB — LACTATE DEHYDROGENASE, PLEURAL OR PERITONEAL FLUID
LD FL: 1610 U/L — AB (ref 3–23)
LD, Fluid: 1629 U/L — ABNORMAL HIGH (ref 3–23)

## 2013-08-05 LAB — PROTEIN, BODY FLUID: TOTAL PROTEIN, FLUID: 2 g/dL

## 2013-08-05 LAB — CARBOXYHEMOGLOBIN
Carboxyhemoglobin: 1.4 % (ref 0.5–1.5)
METHEMOGLOBIN: 0.7 % (ref 0.0–1.5)
O2 SAT: 69.8 %
TOTAL HEMOGLOBIN: 10.5 g/dL — AB (ref 13.5–18.0)

## 2013-08-05 LAB — BODY FLUID CELL COUNT WITH DIFFERENTIAL
Eos, Fluid: 0 %
LYMPHS FL: 2 %
MONOCYTE-MACROPHAGE-SEROUS FLUID: 8 % — AB (ref 50–90)
NEUTROPHIL FLUID: 90 % — AB (ref 0–25)
WBC FLUID: 2159 uL — AB (ref 0–1000)

## 2013-08-05 LAB — GRAM STAIN

## 2013-08-05 LAB — LACTATE DEHYDROGENASE: LDH: 277 U/L — ABNORMAL HIGH (ref 94–250)

## 2013-08-05 LAB — PROTEIN, TOTAL: Total Protein: 5 g/dL — ABNORMAL LOW (ref 6.0–8.3)

## 2013-08-05 LAB — VANCOMYCIN, TROUGH: Vancomycin Tr: 19.8 ug/mL (ref 10.0–20.0)

## 2013-08-05 MED ORDER — OXYCODONE-ACETAMINOPHEN 5-325 MG PO TABS
1.0000 | ORAL_TABLET | ORAL | Status: DC | PRN
  Administered 2013-08-05 (×2): 1 via ORAL
  Administered 2013-08-06: 2 via ORAL
  Administered 2013-08-06: 1 via ORAL
  Filled 2013-08-05: qty 2
  Filled 2013-08-05 (×2): qty 1
  Filled 2013-08-05: qty 2
  Filled 2013-08-05: qty 1

## 2013-08-05 MED ORDER — MORPHINE SULFATE 4 MG/ML IJ SOLN
INTRAMUSCULAR | Status: AC
  Filled 2013-08-05: qty 1

## 2013-08-05 MED ORDER — MORPHINE SULFATE 4 MG/ML IJ SOLN: 4.0000 mg | Freq: Once | INTRAMUSCULAR | Status: DC

## 2013-08-05 MED ORDER — MORPHINE SULFATE 4 MG/ML IJ SOLN
4.0000 mg | Freq: Once | INTRAMUSCULAR | Status: AC
  Administered 2013-08-05: 4 mg via INTRAVENOUS

## 2013-08-05 MED ORDER — TRAMADOL HCL 50 MG PO TABS
50.0000 mg | ORAL_TABLET | Freq: Four times a day (QID) | ORAL | Status: DC | PRN
  Administered 2013-08-05 – 2013-08-07 (×6): 50 mg via ORAL
  Filled 2013-08-05 (×6): qty 1

## 2013-08-05 MED ORDER — FUROSEMIDE 10 MG/ML IJ SOLN
40.0000 mg | Freq: Once | INTRAMUSCULAR | Status: AC
  Administered 2013-08-05: 40 mg via INTRAVENOUS
  Filled 2013-08-05: qty 4

## 2013-08-05 MED ORDER — NOREPINEPHRINE BITARTRATE 1 MG/ML IJ SOLN
2.0000 ug/min | INTRAVENOUS | Status: DC
  Administered 2013-08-05: 12 ug/min via INTRAVENOUS
  Filled 2013-08-05: qty 8

## 2013-08-05 MED ORDER — SODIUM CHLORIDE 0.9 % IV SOLN
500.0000 mg | Freq: Four times a day (QID) | INTRAVENOUS | Status: DC
  Administered 2013-08-05 – 2013-08-06 (×5): 500 mg via INTRAVENOUS
  Filled 2013-08-05 (×6): qty 500

## 2013-08-05 MED ORDER — MORPHINE SULFATE 4 MG/ML IJ SOLN
INTRAMUSCULAR | Status: AC
  Administered 2013-08-05: 4 mg
  Filled 2013-08-05: qty 1

## 2013-08-05 MED ORDER — IOHEXOL 300 MG/ML  SOLN
75.0000 mL | Freq: Once | INTRAMUSCULAR | Status: AC | PRN
  Administered 2013-08-05: 75 mL via INTRAVENOUS

## 2013-08-05 NOTE — Procedures (Addendum)
Thoracentesis Procedure Note  Pre-operative Diagnosis: L pleural effusion   Post-operative Diagnosis: same  Indications: L  pleural effusion   Procedure Details  Consent: Informed consent was obtained. Risks of the procedure were discussed including: infection, bleeding, pain, pneumothorax.  Under sterile conditions the patient was positioned. Betadine solution and sterile drapes were utilized.  2% buffered lidocaine was used to anesthetize the  LEFT posterior 5th rib space. Fluid was obtained without any difficulties and minimal blood loss.  A dressing was applied to the wound and wound care instructions were provided.   Findings 1000 ml of clear yellow pleural fluid was obtained from the LEFT pleural space . A sample was sent to Pathology for cytogenetics, flow, and cell counts, as well as for infection analysis.  Complications:  None; patient tolerated the procedure well.          Condition: stable  Plan A follow up chest x-ray was ordered. Bed Rest for 1 hours. Tylenol 650 mg. for pain.  Attending Attestation: I performed the procedure.  Dorcas Carrowatrick WrightMD Beeper  [phone]  Cell  [phone]  If no response or cell goes to voicemail, call beeper [phone]

## 2013-08-05 NOTE — Progress Notes (Addendum)
[address]       [place] [address]             [phone]      1 Day Post-Op Procedure(s) (LRB): CHEST WALL ABCESS DEBRIDEMENT (N/A) 2 Ply APPLICATION OF WOUND VAC (Left)  Subjective:  Patient doing okay.  He does have some grogginess with use of PCA.    Objective: Vital signs in last 24 hours: Temp:  [97.9 F (36.6 C)-99.1 F (37.3 C)] 97.9 F (36.6 C) (01/17 0810) Pulse Rate:  [80-121] 98 (01/17 0835) Cardiac Rhythm:  [-] Normal sinus rhythm (01/17 0815) Resp:  [8-37] 26 (01/17 0835) BP: (70-133)/(36-82) 110/75 mmHg (01/17 0835) SpO2:  [89 %-96 %] 90 % (01/17 0835) FiO2 (%):  [34 %] 34 % (01/16 2000) Weight:  [179 lb 14.3 oz (81.6 kg)] 179 lb 14.3 oz (81.6 kg) (01/17 0500)  Hemodynamic parameters for last 24 hours: CVP:  [8 mmHg-16 mmHg] 13 mmHg  Intake/Output from previous day: 01/16 0701 - 01/17 0700 In: 3937.1 [I.V.:2887.1; IV Piggyback:1050] Out: 4150 [Urine:4150] Intake/Output this shift: Total I/O In: 97.5 [I.V.:97.5] Out: 160 [Urine:160]  General appearance: alert, cooperative and no distress Heart: regular rate and rhythm Lungs: clear to auscultation bilaterally Abdomen: soft, non-tender; bowel sounds normal; no masses,  no organomegaly Wound: wound vac in place  Lab Results:  Recent Labs  08/04/13 0415 08/05/13 0447  WBC 18.7* 29.6*  HGB 12.9* 11.2*  HCT 37.0* 32.4*  PLT 140* 142*   BMET:  Recent Labs  08/03/13 1840 08/04/13 0415  NA 123* 132*  K 4.1 3.9  CL 85* 100  CO2 18* 19  GLUCOSE 122* 131*  BUN 29* 29*  CREATININE 0.98 1.17  CALCIUM 7.8* 6.5*    PT/INR:  Recent Labs  08/03/13 1840  LABPROT 16.3*  INR 1.34   ABG    Component Value Date/Time   O2SAT 77.0 08/04/2013 0002   CBG (last 3)   Recent Labs  08/03/13 1646 08/03/13 1903  GLUCAP 91 148*    Assessment/Plan: S/P Procedure(s) (LRB): CHEST WALL ABCESS DEBRIDEMENT (N/A) 2 Ply APPLICATION OF WOUND VAC (Left)  1. Wound vac-  leave in place, wound care consult placed, likely first vac change will be this Monday 2. Chest wall Abscess- OR cultures pending, however gram stain with GPC in pairs and chains, continue IV ABX 3. Pain control- has PCA ordered, making patient groggy will order Percocet, and Ultram to use 4. Decrease IV fluids, will place to [facility] once patient tolerating a full diet 5. D/C Foley 6. Dispo-patient doing well, would like to try oral pain medications, ordered placed for percocet and ultram, patient also wishes to advance diet, care per primary    LOS: 2 days    BARRETT, ERIN 08/05/2013  I have seen and examined the patient and agree with the assessment and plan as outlined.  However, patient has developed increased bilateral pleural effusions L>R.  I suspect this is related to generalized volume overload and anasarca, with development of sympathetic effusion, particularly on the side of his abscess.  Results of thoracentesis support this, and empyema seems less likely given the appearance of chest CT scan 2 days ago.  However, the patient does report pleuritic pain on the left side and given the aggressive behavior of this patient's soft tissue infection this will need to be monitored closely.  In addition, use of closed system VAC for wound management may need to be temporarily discontinued and  converted to open packing if localized cellulitis gets any worse.  However, by report the cellulitis and swelling is somewhat improved.  Will continue to follow.  OWEN,CLARENCE H 08/05/2013 2:44 PM

## 2013-08-05 NOTE — Op Note (Signed)
NAMJanalee [name]:  [name], [name]                ACCOUNT NO.:  [id number]  MEDICAL RECORD NO.:  [id number]  LOCATION:  2M13C                        FACILITY:  [facility]  PHYSICIAN:  Kerin PernaPeter [name], M.D.  DATE OF BIRTH:  02/20/1991  DATE OF PROCEDURE:  08/04/2013 DATE OF DISCHARGE:                              OPERATIVE REPORT   OPERATIONS: 1. Incision and drainage with debridement of left anterior chest wall     abscess. 2. Placement of wound VAC drainage system.  SURGEON:  Kerin PernaPeter [name], MD  PREOPERATIVE DIAGNOSIS:  Abscess with surrounding cellulitis of the left anterior chest wall - subpectoral location.  POSTOPERATIVE DIAGNOSIS:  Abscess with surrounding cellulitis of the left anterior chest wall - subpectoral location.  ANESTHESIA:  General.  INDICATIONS:  The patient is a 23 year old otherwise healthy young male who developed spontaneous swelling, tenderness, redness of his left anterior chest over a 4-day period, without preceding incident, event, injury, or insect bite.  He is a nondiabetic.  He is a nonsmoker.  He was admitted directly to the ICU for sepsis and placed on broad-spectrum antibiotics after CT scan showed necrotizing fasciitis with a collection of fluid in the subpectoral region of his left anterior chest wall. There were no intrathoracic abnormalities other than an area of pleural thickening, a very small pleural effusion.  Thoracic surgical evaluation was requested.  A CT scan showed no evidence of infectious or destructive process of the left sternoclavicular joint.  After examining the patient and reviewing the CT scan, I recommended to the patient and his family that he undergo incision, drainage, and debridement of this fluid collection and wound VAC therapy.  I discussed the procedure in detail including the use of general anesthesia, the location of the incision, and the planned use of a wound VAC drainage system postoperatively.  I discussed the potential  risks including the risks of bleeding, recurrent infection, anesthesia complications, and pain.  After reviewing these issues, the patient demonstrated his understanding and with the patient's family, he agreed to proceed with surgery.  FINDINGS: 1. Significant edema of the soft tissues of left anterior chest. 2. A linear space beneath the pectoralis muscle group, but anterior to     the ribs with purulent material and frank pus from the sternum     tracking up to the left shoulder in the area of the     acromioclavicular joint.  OPERATIVE PROCEDURE:  The patient was brought to the operating room and placed supine on the operating room table.  General anesthesia was induced.  The left anterior chest, left arm and shoulder, and left neck were prepped and draped as a sterile field.  A proper time-out was performed.  Previously, ultrasound probe was used to localize the fluid pocket in the left anterior chest.  An incision was made obliquely over the area of the fluid collection, extending from the costal chondral junction towards the left shoulder, measuring approximately 3 to 4 inches.  The pectoralis muscles were split in a muscle oriented direction and the space was immediately entered and purulent material appeared which was cultured and then drained.  The space was completely opened and exposed and there was  some evidence of necrosis of some of the muscle deep into the space which was sharply debrided.  The ribs were normal and had normal texture.  The space did not extend to the subclavian vessels or the shoulder joint.  The space did not go deep to the sternal chondral junction on the medial side.  The pulse lavage irrigator was used to irrigate the space after the incision, drainage, and debridement.  Then, I irrigated with another L of vancomycin irrigation.  Hemostasis was obtained.  The wound was then packed with a wound VAC sponge, one placed deep into the wound and the  second one placed more superficially in the subcutaneous space.  This was covered with the sterile Vi-Drape dressing with an opening at the top and then connected to the wound VAC pump and canister.  The patient was extubated and returned to the recovery room in stable condition.     Kerin Perna, M.D.     PV/MEDQ  D:  08/04/2013  T:  08/05/2013  Job:  [id number]

## 2013-08-05 NOTE — Consult Note (Addendum)
Reason for Consult:concern for worsening soft tissue infection Referring Physician: Dr Asencion Noble  [name] [name] is an 23 y.o. male.  HPI: 23 yo WM s/p incision, drainage and debridement of left upper chest wall soft tissue abscess and placement of wound vac yesterday. CCM calling b/c persistent and some progressive erythema extending down chest wall and LLQ. Pt denies abd pain, abd wall tenderness, chest wall skin tenderness other than around wound. Pt had thoracentesis earlier today b/c pleural fluid collection.   Pertinent immediate history - acute onset last Saturday of left pain in his axilla and arm. He had been playing basketball the day before and thought perhaps he pulled a muscle and then slept on it in the wrong way. During the course of that day he then began to feel more ill became fatigued nauseous and after eating his food vomited. Nausea malaise persisted along with some subjective fevers he was seen by his primary care physician on Monday. He was prescribed Tamiflu with no improvement in his symptoms. He then went to the emergency room at [facility] where he was given intravenous fluids. At that time there is noted chest films performed. He then went to see an orthopedic surgeon who performed films of the ribs to make sure he had no rib fractures. He continued to feel poorly with progressive pain in his left axilla and chest especially when he was vomiting. He then saw a chiropractor who is a friend of the family who immediately felt confident the patient was suffering from a bad soft tissue infection. The patient ultimately then came to [facility] for CT scan which was done on the 15th and showed a large left-sided chest wall abscess with gaseous formation within the chest wall.   Past Medical History  Diagnosis Date  . Medical history non-contributory     Past Surgical History  Procedure Laterality Date  . Incision, drainage and debridement of chest wall abscess,  placement of wound vac      History reviewed. No pertinent family history.  Social History:  reports that he has never smoked. He has never used smokeless tobacco. He reports that he does not drink alcohol or use illicit drugs.  Allergies: No Known Allergies  Medications: I have reviewed the patient's current medications.  Results for orders placed during the hospital encounter of 08/03/13 (from the past 48 hour(s))  CBC     Status: Abnormal   Collection Time    08/03/13  6:40 PM      Result Value Range   WBC 21.4 (*) 4.0 - 10.5 K/uL   RBC 5.22  4.22 - 5.81 MIL/uL   Hemoglobin 16.2  13.0 - 17.0 g/dL   HCT 44.7  39.0 - 52.0 %   MCV 85.6  78.0 - 100.0 fL   MCH 31.0  26.0 - 34.0 pg   MCHC 36.2 (*) 30.0 - 36.0 g/dL   RDW 12.7  11.5 - 15.5 %   Platelets 176  150 - 400 K/uL  COMPREHENSIVE METABOLIC PANEL     Status: Abnormal   Collection Time    08/03/13  6:40 PM      Result Value Range   Sodium 123 (*) 137 - 147 mEq/L   Potassium 4.1  3.7 - 5.3 mEq/L   Chloride 85 (*) 96 - 112 mEq/L   CO2 18 (*) 19 - 32 mEq/L   Glucose, Bld 122 (*) 70 - 99 mg/dL   BUN 29 (*) 6 - 23 mg/dL  Creatinine, Ser 0.98  0.50 - 1.35 mg/dL   Calcium 7.8 (*) 8.4 - 10.5 mg/dL   Total Protein 5.6 (*) 6.0 - 8.3 g/dL   Albumin 1.8 (*) 3.5 - 5.2 g/dL   AST 39 (*) 0 - 37 U/L   ALT 15  0 - 53 U/L   Alkaline Phosphatase 68  39 - 117 U/L   Total Bilirubin 2.0 (*) 0.3 - 1.2 mg/dL   GFR calc non Af Amer >90  >90 mL/min   GFR calc Af Amer >90  >90 mL/min   Comment: (NOTE)     The eGFR has been calculated using the CKD EPI equation.     This calculation has not been validated in all clinical situations.     eGFR's persistently <90 mL/min signify possible Chronic Kidney     Disease.  MAGNESIUM     Status: None   Collection Time    08/03/13  6:40 PM      Result Value Range   Magnesium 2.1  1.5 - 2.5 mg/dL  PHOSPHORUS     Status: None   Collection Time    08/03/13  6:40 PM      Result Value Range    Phosphorus 3.7  2.3 - 4.6 mg/dL  LACTIC ACID, PLASMA     Status: Abnormal   Collection Time    08/03/13  6:40 PM      Result Value Range   Lactic Acid, Venous 3.6 (*) 0.5 - 2.2 mmol/L  PROCALCITONIN     Status: None   Collection Time    08/03/13  6:40 PM      Result Value Range   Procalcitonin 27.51     Comment:            Interpretation:     PCT >= 10 ng/mL:     Important systemic inflammatory response,     almost exclusively due to severe bacterial     sepsis or septic shock.     (NOTE)             ICU PCT Algorithm               Non ICU PCT Algorithm        ----------------------------     ------------------------------             PCT < 0.25 ng/mL                 PCT < 0.1 ng/mL         Stopping of antibiotics            Stopping of antibiotics           strongly encouraged.               strongly encouraged.        ----------------------------     ------------------------------           PCT level decrease by               PCT < 0.25 ng/mL           >= 80% from peak PCT           OR PCT 0.25 - 0.5 ng/mL          Stopping of antibiotics  encouraged.         Stopping of antibiotics               encouraged.        ----------------------------     ------------------------------           PCT level decrease by              PCT >= 0.25 ng/mL           < 80% from peak PCT            AND PCT >= 0.5 ng/mL            Continuing antibiotics                                                  encouraged.           Continuing antibiotics                encouraged.        ----------------------------     ------------------------------         PCT level increase compared          PCT > 0.5 ng/mL             with peak PCT AND              PCT >= 0.5 ng/mL             Escalation of antibiotics                                              strongly encouraged.          Escalation of antibiotics            strongly encouraged.  PROTIME-INR      Status: Abnormal   Collection Time    08/03/13  6:40 PM      Result Value Range   Prothrombin Time 16.3 (*) 11.6 - 15.2 seconds   INR 1.34  0.00 - 1.49  CULTURE, BLOOD (ROUTINE X 2)     Status: None   Collection Time    08/03/13  6:40 PM      Result Value Range   Specimen Description BLOOD RIGHT HAND     Special Requests BOTTLES DRAWN AEROBIC ONLY 4CC PT ON ZOSYN,VANC     Culture  Setup Time       Value: 08/03/2013 21:12     Performed at [facility]   Culture       Value:        BLOOD CULTURE RECEIVED NO GROWTH TO DATE CULTURE WILL BE HELD FOR 5 DAYS BEFORE ISSUING A FINAL NEGATIVE REPORT     Performed at [facility]   Report Status PENDING    CULTURE, BLOOD (ROUTINE X 2)     Status: None   Collection Time    08/03/13  6:46 PM      Result Value Range   Specimen Description BLOOD LEFT HAND     Special Requests BOTTLES DRAWN AEROBIC ONLY 1.5CC PT ON ZOSYN VANCO     Culture  Setup Time       Value: 08/03/2013 21:11  Performed at [facility]   Culture       Value:        BLOOD CULTURE RECEIVED NO GROWTH TO DATE CULTURE WILL BE HELD FOR 5 DAYS BEFORE ISSUING A FINAL NEGATIVE REPORT     Performed at [facility]   Report Status PENDING    GLUCOSE, CAPILLARY     Status: Abnormal   Collection Time    08/03/13  7:03 PM      Result Value Range   Glucose-Capillary 148 (*) 70 - 99 mg/dL  CORTISOL     Status: None   Collection Time    08/03/13  9:00 PM      Result Value Range   Cortisol, Plasma 27.8     Comment: (NOTE)     AM:  4.3 - 22.4 ug/dL     PM:  3.1 - 16.7 ug/dL     Performed at [facility]  LACTIC ACID, PLASMA     Status: Abnormal   Collection Time    08/03/13 10:00 PM      Result Value Range   Lactic Acid, Venous 2.9 (*) 0.5 - 2.2 mmol/L  CARBOXYHEMOGLOBIN     Status: None   Collection Time    08/04/13 12:02 AM      Result Value Range   Total hemoglobin 14.9  13.5 - 18.0 g/dL   O2 Saturation 77.0      Carboxyhemoglobin 1.3  0.5 - 1.5 %   Methemoglobin 1.3  0.0 - 1.5 %  TYPE AND SCREEN     Status: None   Collection Time    08/04/13  3:00 AM      Result Value Range   ABO/RH(D) O POS     Antibody Screen NEG     Sample Expiration 08/07/2013    URINALYSIS, ROUTINE W REFLEX MICROSCOPIC     Status: Abnormal   Collection Time    08/04/13  3:45 AM      Result Value Range   Color, Urine AMBER (*) YELLOW   Comment: BIOCHEMICALS MAY BE AFFECTED BY COLOR   APPearance CLOUDY (*) CLEAR   Specific Gravity, Urine >1.046 (*) 1.005 - 1.030   pH 5.5  5.0 - 8.0   Glucose, UA NEGATIVE  NEGATIVE mg/dL   Hgb urine dipstick LARGE (*) NEGATIVE   Bilirubin Urine SMALL (*) NEGATIVE   Ketones, ur NEGATIVE  NEGATIVE mg/dL   Protein, ur 30 (*) NEGATIVE mg/dL   Urobilinogen, UA 2.0 (*) 0.0 - 1.0 mg/dL   Nitrite NEGATIVE  NEGATIVE   Leukocytes, UA NEGATIVE  NEGATIVE  URINE MICROSCOPIC-ADD ON     Status: None   Collection Time    08/04/13  3:45 AM      Result Value Range   WBC, UA 0-2  <3 WBC/hpf   RBC / HPF 7-10  <3 RBC/hpf   Bacteria, UA RARE  RARE   Urine-Other AMORPHOUS URATES/PHOSPHATES    CBC     Status: Abnormal   Collection Time    08/04/13  4:15 AM      Result Value Range   WBC 18.7 (*) 4.0 - 10.5 K/uL   RBC 4.26  4.22 - 5.81 MIL/uL   Hemoglobin 12.9 (*) 13.0 - 17.0 g/dL   Comment: DELTA CHECK NOTED     REPEATED TO VERIFY   HCT 37.0 (*) 39.0 - 52.0 %   MCV 86.9  78.0 - 100.0 fL   MCH 30.3  26.0 - 34.0 pg  MCHC 34.9  30.0 - 36.0 g/dL   RDW 12.9  11.5 - 15.5 %   Platelets 140 (*) 150 - 400 K/uL  BASIC METABOLIC PANEL     Status: Abnormal   Collection Time    08/04/13  4:15 AM      Result Value Range   Sodium 132 (*) 137 - 147 mEq/L   Comment: DELTA CHECK NOTED   Potassium 3.9  3.7 - 5.3 mEq/L   Chloride 100  96 - 112 mEq/L   Comment: DELTA CHECK NOTED   CO2 19  19 - 32 mEq/L   Glucose, Bld 131 (*) 70 - 99 mg/dL   BUN 29 (*) 6 - 23 mg/dL   Creatinine, Ser 1.17  0.50 - 1.35 mg/dL     Calcium 6.5 (*) 8.4 - 10.5 mg/dL   GFR calc non Af Amer 89 (*) >90 mL/min   GFR calc Af Amer >90  >90 mL/min   Comment: (NOTE)     The eGFR has been calculated using the CKD EPI equation.     This calculation has not been validated in all clinical situations.     eGFR's persistently <90 mL/min signify possible Chronic Kidney     Disease.  CORTISOL-AM, BLOOD     Status: Abnormal   Collection Time    08/04/13  4:15 AM      Result Value Range   Cortisol - AM 37.1 (*) 4.3 - 22.4 ug/dL   Comment: Performed at [facility]  ABO/RH     Status: None   Collection Time    08/04/13  4:30 AM      Result Value Range   ABO/RH(D) O POS    HIV ANTIBODY (ROUTINE TESTING)     Status: None   Collection Time    08/04/13  7:30 AM      Result Value Range   HIV NON REACTIVE  NON REACTIVE   Comment: Performed at [place]     Status: None   Collection Time    08/04/13  9:52 AM      Result Value Range   Specimen Description ABSCESS CHEST LEFT     Special Requests LEFT CHEST WALL     Gram Stain       Value: ABUNDANT WBC PRESENT,BOTH PMN AND MONONUCLEAR     ABUNDANT GRAM POSITIVE COCCI     IN PAIRS IN CHAINS Performed at [facility] Gram Stain Report Called to,Read Back By and Verified With: Gram Stain Report Called to,Read Back By and Verified With: OR ON 08/04/13 AT 1130 BY K SCHULTZ     Performed at [facility]   Culture       Value: NO ANAEROBES ISOLATED; CULTURE IN PROGRESS FOR 5 DAYS     Performed at [facility]   Report Status PENDING    CULTURE, ROUTINE-ABSCESS     Status: None   Collection Time    08/04/13  9:52 AM      Result Value Range   Specimen Description ABSCESS CHEST LEFT     Special Requests LEFT CHEST WALL     Gram Stain       Value: RARE WBC PRESENT, PREDOMINANTLY PMN     FEW GRAM POSITIVE COCCI IN PAIRS     FEW GRAM POSITIVE COCCI IN CLUSTERS     Performed at [facility]   Culture       Value:  Culture reincubated for  better growth     Performed at [place]     Status: None   Collection Time    08/04/13  9:52 AM      Result Value Range   Specimen Description ABSCESS CHEST LEFT     Special Requests LEFT CHEST WALL     Gram Stain       Value: ABUNDANT WBC PRESENT,BOTH PMN AND MONONUCLEAR     ABUNDANT GRAM POSITIVE COCCI IN PAIRS IN CHAINS     CALLED TO OR 08/04/13 1130 BY K SCHULTZ   Report Status 08/04/2013 FINAL    ANAEROBIC CULTURE     Status: None   Collection Time    08/04/13 10:07 AM      Result Value Range   Specimen Description TISSUE CHEST LEFT     Special Requests TISSUE FROM LEFT CHEST WALL ABSCESS     Gram Stain       Value: RARE WBC PRESENT, PREDOMINANTLY MONONUCLEAR     RARE GRAM POSITIVE COCCI     IN PAIRS IN CHAINS Performed at [place] ON 08/04/13 AT [place] BY K SCHULTZ     Performed at [facility]   Culture       Value: NO ANAEROBES ISOLATED; CULTURE IN PROGRESS FOR 5 DAYS     Performed at [facility]   Report Status PENDING    TISSUE CULTURE     Status: None   Collection Time    08/04/13 10:07 AM      Result Value Range   Specimen Description TISSUE CHEST LEFT     Special Requests TISSUE FROM LEFT CHEST WALL ABSCESS     Gram Stain       Value: Performed at [facility] RARE WBC PRESENT,BOTH PMN AND MONONUCLEAR     RARE GRAM POSITIVE COCCI     IN PAIRS IN CHAINS Gram Stain Report Called to,Read Back By and Verified With: Gram Stain Report Called to,Read Back By and Verified With:  DR [name] ON 08/04/13 BY K SCHULTZ CORRECTED RESULTS CALLED TO: JAMES D ON 08/05/13 AT 0830 BY SMIAS     Performed at [facility]   Culture       Value: Culture reincubated for better growth     Performed at [place]     Status: None   Collection Time    08/04/13 10:07 AM      Result Value Range   Specimen  Description TISSUE CHEST LEFT     Special Requests TISSUE FROM LEFT CHEST WALL ABSCESS     Gram Stain       Value: RARE WBC PRESENT, PREDOMINANTLY MONONUCLEAR     RARE GRAM POSITIVE COCCI IN PAIRS IN CHAINS     CALLED TO DR [name] 08/04/13 1158 BY K SCHULTZ   Report Status 08/04/2013 FINAL    LACTIC ACID, PLASMA     Status: Abnormal   Collection Time    08/04/13  1:00 PM      Result Value Range   Lactic Acid, Venous 3.3 (*) 0.5 - 2.2 mmol/L  CBC     Status: Abnormal   Collection Time    08/05/13  4:47 AM      Result Value Range   WBC 29.6 (*) 4.0 - 10.5 K/uL   RBC 3.76 (*)  4.22 - 5.81 MIL/uL   Hemoglobin 11.2 (*) 13.0 - 17.0 g/dL   HCT 32.4 (*) 39.0 - 52.0 %   MCV 86.2  78.0 - 100.0 fL   MCH 29.8  26.0 - 34.0 pg   MCHC 34.6  30.0 - 36.0 g/dL   RDW 13.1  11.5 - 15.5 %   Platelets 142 (*) 150 - 400 K/uL  CARBOXYHEMOGLOBIN     Status: Abnormal   Collection Time    08/05/13 10:45 AM      Result Value Range   Total hemoglobin 10.5 (*) 13.5 - 18.0 g/dL   O2 Saturation 69.8     Carboxyhemoglobin 1.4  0.5 - 1.5 %   Methemoglobin 0.7  0.0 - 1.5 %  LACTATE DEHYDROGENASE     Status: Abnormal   Collection Time    08/05/13  2:36 PM      Result Value Range   LDH 277 (*) 94 - 250 U/L  PROTEIN, TOTAL     Status: Abnormal   Collection Time    08/05/13  2:36 PM      Result Value Range   Total Protein 5.0 (*) 6.0 - 8.3 g/dL    Ct Chest W Contrast  08/05/2013   CLINICAL DATA:  Evaluate for infiltrates or empyema.  EXAM: CT CHEST WITH CONTRAST  TECHNIQUE: Multidetector CT imaging of the chest was performed during intravenous contrast administration.  CONTRAST:  43m OMNIPAQUE IOHEXOL 300 MG/ML  SOLN  COMPARISON:  CT scan August 03, 2013.  FINDINGS: On the prior study, there was an abscess reported in the deep left chest wall. This appears to have been evacuated surgically. Is now replaced by a cavitary region filled with air and debris or possibly packing material. This communicates with  the skin surface and there is 8 wound VAC over the area.  New from the prior study is a development of moderate bilateral pleural effusions with extensive underlying atelectasis. On the left, in the upper lobe medially, there appears to be a partially loculated component of the pleural effusion with thin enhancing rim. This measures 53 x 17 mm. The pleural effusions otherwise do not show evidence of loculation. In the there is no significant hilar or mediastinal adenopathy.  There are patchy peripheral nodular infiltrates in the aerated portion of the right upper and middle lobe. There are similar findings on the left.  There is diffuse subcutaneous soft tissue increased attenuation consistent with anasarca.  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  IMPRESSION: 1. Postoperative change involving left chest wall abscess. 2. Interval development of fluid overload with anasarca and significant bilateral pleural effusion. There is apparent loculation of a component of the left pleural effusion involving the pleural surface medially at the left lung apex. Sterility uncertain. 3. Extensive passive atelectasis. Additionally the aerated portions of the lungs show patchy nodular peripheral infiltrates. These could represent areas of developing pulmonary edema as well as septic emboli. Radiographic followup recommended   Electronically Signed   By: RSkipper ClicheM.D.   On: 08/05/2013 11:58   Dg Chest Port 1 View  08/05/2013   CLINICAL DATA:  Status post thoracentesis on the left  EXAM: PORTABLE CHEST - 1 VIEW  COMPARISON:  CT scan of the chest and portable chest x-ray of today's date.  FINDINGS: The lungs are reasonably well inflated. The interstitial markings remain increased bilaterally. There is slightly less density on the left consistent with aspiration of pleural fluid but  some but a moderate amount of fluid likely remains bilaterally. The cardiopericardial silhouette  is enlarged. The central pulmonary vascularity is prominent. There is alveolar infiltrate in the right infrahilar region. The right internal jugular venous catheter tip lies in the region of the junction of the proximal and mid SVC and is stable. There is gas within bowel in the left upper quadrant of the abdomen.  IMPRESSION: 1. There is no evidence of a post procedure pneumothorax on the left. A small to moderate amount of pleural fluid remains bilaterally. 2. The cardiopericardial silhouette and pulmonary vascularity remain prominent consistent with CHF. 3. Alveolar density in the right infrahilar region is worrisome for pneumonia.   Electronically Signed   By: David  Martinique   On: 08/05/2013 14:06   Dg Chest Port 1 View  08/05/2013   CLINICAL DATA:  Suspected left pleural effusion  EXAM: PORTABLE CHEST - 1 VIEW  COMPARISON:  Portable chest x-ray of August 04, 2013  FINDINGS: On the left the pulmonary interstitium has increased in conspicuity and further obscuration of the left hemidiaphragm has occurred. On the right there is new increased density in the perihilar and infrahilar region consistent with atelectasis. A small amount of pleural fluid has accumulated on the right in addition to that known to be present on the left. The cardiopericardial silhouette is top-normal in size. The pulmonary vascularity is indistinct. There is a right internal jugular venous catheter in place whose tip lies in the region of the midportion of the SVC.  IMPRESSION: Increasing interstitial density within both lungs is consistent with atelectasis or pneumonia. Obscuration of the left hemidiaphragm is likely in part due to a small to moderate-sized pleural effusion. There is a new small right pleural effusion as well.   Electronically Signed   By: David  Martinique   On: 08/05/2013 09:15   Dg Chest Port 1 View  08/04/2013   CLINICAL DATA:  Preoperative evaluation for drainage of a chest abscess  EXAM: PORTABLE CHEST - 1 VIEW   COMPARISON:  Portable exam 0813 hr compared to chest radiograph of 08/03/2013 and CT chest of 08/03/2013  FINDINGS: Tip of right jugular central venous catheter projects over SVC.  Minimal enlargement of cardiac silhouette.  Mediastinal contours and pulmonary vascularity normal.  Bronchitic changes.  Increased attenuation of the left hemi thorax corresponds to marked asymmetric chest wall soft tissue thickening identified on recent CT at site of chest wall abscess.  Small left pleural effusion is present.  No pneumothorax or acute osseous findings.  IMPRESSION: Mild enlargement of cardiac silhouette with small left pleural effusion   Electronically Signed   By: Lavonia Dana M.D.   On: 08/04/2013 08:20   Dg Chest Port 1 View  08/03/2013   CLINICAL DATA:  Lata placement.  EXAM: PORTABLE CHEST - 1 VIEW  COMPARISON:  CT chest 08/03/2013.  FINDINGS: The patient has a new right IJ approach central venous catheter. Tip of the catheter projects over the mid superior vena cava. There is no pneumothorax. Lungs are clear. Heart size is normal. Increased density over the left chest is consistent with chest wall abscess as seen on CT scan.  IMPRESSION: Tip of right IJ catheter projects over the mid superior vena cava. No pneumothorax.  Increased density over the left chest is consistent with anterior chest wall abscess as seen on CT scan.   Electronically Signed   By: Inge Rise M.D.   On: 08/03/2013 21:40    Review of Systems  Constitutional: Positive for weight loss and malaise/fatigue. Negative for fever and chills.  HENT: Negative for nosebleeds.   Eyes: Negative for blurred vision.  Respiratory: Negative for shortness of breath.   Cardiovascular: Negative for chest pain, palpitations, orthopnea and PND.       Denies DOE  Gastrointestinal: Positive for vomiting. Negative for abdominal pain. Nausea: prior to hospitalization.  Genitourinary: Negative for dysuria and hematuria.  Musculoskeletal: Negative.          Left axilla pain recently Left lateral chest wall pain  Skin: Negative for itching and rash.  Neurological: Negative for dizziness, focal weakness, seizures, loss of consciousness and headaches.       Denies TIAs, amaurosis fugax  Endo/Heme/Allergies: Does not bruise/bleed easily.  Psychiatric/Behavioral: The patient is not nervous/anxious.    Blood pressure 112/69, pulse 103, temperature 98.3 F (36.8 C), temperature source Oral, resp. rate 41, height _0  (1.778 m), weight 179 lb 14.3 oz (81.6 kg), SpO2 98.00%. Physical Exam  Vitals reviewed. Constitutional: He is oriented to person, place, and time. He appears well-developed and well-nourished. No distress.  Smiling laughing  HENT:  Head: Normocephalic and atraumatic.  Right Ear: External ear normal.  Left Ear: External ear normal.  Eyes: Conjunctivae are normal. No scleral icterus.  Neck: Normal range of motion. No tracheal deviation present.  Cardiovascular: Normal rate and intact distal pulses.   Respiratory: Effort normal and breath sounds normal. No respiratory distress. He has no wheezes.    Wound vac removed - wound base clean. No necrotic tissue. No purulent fluid.   GI: Soft. He exhibits no distension. There is no tenderness. There is no rebound.    Extensive blanching erythema Left chest wall extend down to LLQ. No induration, no crepitus. nontender to palpation. Skin soft. Cellulitis extends along entire Left axillary space from axilla down to ASIS to posterior axillary line.   Genitourinary:     Significant scrotal edema, nontender, no cellulitis.   Lymphadenopathy:    He has no cervical adenopathy.  Neurological: He is alert and oriented to person, place, and time.  Skin: He is not diaphoretic.  Some L foot swelling compared to Rt foot. Very subtle erythema in L inner thigh.         Assessment/Plan: Left pleural effusion SIRS Chest wall abscess s/p I&D&debridement Abdominal wall  cellulitis  Wound vac removed. Base of wound looks good. Pt not tachy. Not on pressors. nontender when press on abd wall skin/groin/scrotum. Skin soft. Concern for progression of on-going soft tissue "necrotizing" infection low - def has superficial skin cellulitis but see no need for on-going debridement for now.   Cont IV abx per ID VTE prophylaxis F/u culture rec w-d dressing to chest wall for now - but will defer to thoracic  Leighton Ruff. Redmond Pulling, MD, FACS General, Bariatric, & Minimally Invasive Surgery [facility], [place]   [facility] M 08/05/2013, 5:19 PM

## 2013-08-05 NOTE — Progress Notes (Signed)
TCTS BRIEF PROGRESS NOTE   Called to see patient regarding possible extension of cellulitis with concerns for possible necrotizing infection.  Patient's wound VAC removed with Dr Andrey CampanileWilson and wound explored at bedside.  No signs of necrotic tissue.  No crepitis on exam.  Cellulitis essentially unchanged according to patient's family with perhaps some mild extension inferiorly.  Under the circumstances I favor discontinuing closed system wound VAC and beginning frequent dressing changes.  Will continue to follow closely.  Nekesha Font H 08/05/2013 5:41 PM

## 2013-08-05 NOTE — Progress Notes (Signed)
Name: Olam IdlerJoshua Cicio MRN: [id number] DOB: [birth year]    ADMISSION DATE:  08/03/2013  REFERRING MD :  EDP  PRIMARY SERVICE: PCCM  CHIEF COMPLAINT:  Abscess   BRIEF PATIENT DESCRIPTION: 23 yo male without medical history admitted 1/15 with large L chest wall abscess with gaseous formation concerning for necrotizing fascitis.   SIGNIFICANT EVENTS / STUDIES:  1/15 CT abd/pelvis >>> 6.6x2.7cm L anterior chest wall abscess with marked edema throughout, gas formation and ?nec fasc   LINES / TUBES:  R IJ CVL 1/15 >>> Wound vac Lupper chest  CULTURES: 1/15  Blood >>> 1/16 wound cult  ANTIBIOTICS: Vanc 1/15 >>> Zosyn 1/15 >>>  Clindamycin 1/15 >>>  INTERVAL HISTORY:  More dyspneic, more oxygen  VITAL SIGNS: Temp:  [97.9 F (36.6 C)-99.1 F (37.3 C)] 97.9 F (36.6 C) (01/17 0810) Pulse Rate:  [80-121] 98 (01/17 0835) Resp:  [8-37] 26 (01/17 0835) BP: (70-133)/(36-82) 110/75 mmHg (01/17 0835) SpO2:  [89 %-96 %] 90 % (01/17 0835) FiO2 (%):  [34 %] 34 % (01/16 2000) Weight:  [81.6 kg (179 lb 14.3 oz)] 81.6 kg (179 lb 14.3 oz) (01/17 0500)  HEMODYNAMICS: CVP:  [8 mmHg-16 mmHg] 13 mmHg  VENTILATOR SETTINGS: Vent Mode:  [-]  FiO2 (%):  [34 %] 34 % INTAKE / OUTPUT: Intake/Output     01/16 0701 - 01/17 0700 01/17 0701 - 01/18 0700   I.V. (mL/kg) 2887.1 (35.4) 97.5 (1.2)   IV Piggyback 1050    Total Intake(mL/kg) 3937.1 (48.2) 97.5 (1.2)   Urine (mL/kg/hr) 4150 (2.1) 315 (1.2)   Total Output 4150 315   Net -212.9 -217.5          PHYSICAL EXAMINATION: General:  Mild labored breathing Neuro:  Awake, alert, appropriate, MAE  HEENT:  Mm moist, no JVD  Cardiovascular:  s1s2 rrr, tachy  Lungs: mildly labored Abdomen:  Soft, +bs  Musculoskeletal:  L chest wall/shoulder swelling, erythematous, hot, tight, tender to touch, markings from last night and expanded greatly down arm and across chest and down chest wall.   LABS:  CBC  Recent Labs Lab 08/03/13 1840 08/04/13 0415  08/05/13 0447  WBC 21.4* 18.7* 29.6*  HGB 16.2 12.9* 11.2*  HCT 44.7 37.0* 32.4*  PLT 176 140* 142*   Coag's  Recent Labs Lab 08/03/13 1840  INR 1.34   BMET  Recent Labs Lab 08/03/13 1840 08/04/13 0415  NA 123* 132*  K 4.1 3.9  CL 85* 100  CO2 18* 19  BUN 29* 29*  CREATININE 0.98 1.17  GLUCOSE 122* 131*   Electrolytes  Recent Labs Lab 08/03/13 1840 08/04/13 0415  CALCIUM 7.8* 6.5*  MG 2.1  --   PHOS 3.7  --    Sepsis Markers  Recent Labs Lab 08/03/13 1840 08/03/13 2200 08/04/13 1300  LATICACIDVEN 3.6* 2.9* 3.3*  PROCALCITON 27.51  --   --    ABG No results found for this basename: PHART, PCO2ART, PO2ART,  in the last 168 hours  Liver Enzymes  Recent Labs Lab 08/03/13 1840  AST 39*  ALT 15  ALKPHOS 68  BILITOT 2.0*  ALBUMIN 1.8*   Cardiac Enzymes No results found for this basename: TROPONINI, PROBNP,  in the last 168 hours Glucose  Recent Labs Lab 08/03/13 1646 08/03/13 1903  GLUCAP 91 148*   CXR: 1/17>>>increased infiltrates, may be more fluid L chest  ASSESSMENT / PLAN:  PULMONARY A:  ALI pattern, L effusion.,  Acute hypoxemic Resp failure P:   More oxygen U/s  L chest ??Needs CT on L  CARDIOVASCULAR A:  Septic shock. P:  Goal MAP > 65 Off neo Lactic acid higher Check coox   RENAL A:   Mild hyponatremia. P:   Goal CVP 8-10 Trend BMP NS@75   GASTROINTESTINAL A:   Nutrition. GI Px. P:   clr liq only Protonix  HEMATOLOGIC A:   DVT Px P:  Trend CBC Heparin  INFECTIOUS A:   Chest wall abscess. Suspected ecrotizing fasciitis. P:   ID consult Abx as above  ENDOCRINE A:   Suspected adrenal insufficiency. P:   Stress dose steroids pending cortisol level  NEUROLOGIC A:   Pain. P:   Dilaudid PCA   I have personally obtained history, examined patient, evaluated and interpreted laboratory and imaging results, reviewed medical records, formulated assessment / plan and placed orders.  CRITICAL  CARE:  The patient is critically ill with multiple organ systems failure and requires high complexity decision making for assessment and support, frequent evaluation and titration of therapies, application of advanced monitoring technologies and extensive interpretation of multiple databases. Critical Care Time devoted to patient care services described in this note is 40 minutes.   Shan Levans, MD Beeper  [phone]  Cell  [phone]  If no response or cell goes to voicemail, call beeper [phone]  Pulmonary and Critical Care Medicine [facility] Pager: [phone]  08/05/2013, 10:06 AM

## 2013-08-05 NOTE — Consult Note (Signed)
[facility]    Date of Admission:  08/03/2013  Date of Consult:  08/05/2013  Reason for Consult: severe chest wall infection and empyema Referring Physician: Dr. Joya Gaskins   HPI: [name] [name] is an 23 y.o. 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 He had been playing basketball the day before and thought perhaps he pulled a muscle and then slept on it in the wrong way. During the course of that day he then began to feel more ill became fatigued nauseous and after eating his food vomited. Nausea malaise persisted along with some subjective fevers he was seen by his primary care physician on Monday. He was prescribed Tamiflu with no improvement in his symptoms. He then went to the emergency room at [facility] where he was given intravenous fluids. At that time there is noted chest films performed. He then went to see an orthopedic surgeon who performed films of the ribs to make sure he had no rib fractures. He continued to feel poorly with progressive pain in his left axilla and chest especially when he was vomiting. He then saw a chiropractor who is a friend of the family who immediately felt confident the patient was suffering from a bad soft tissue infection. The patient ultimately then came to [facility] for CT scan which was done on the 15th and showed a large left-sided chest wall abscess with gaseous formation within the chest wall.  The patient was started on clindamycin Zosyn and vancomycin. Dr. Prescott Gum saw the patient and performed I and D of chest wall. Cultures are now growing abundant Gram positive cocci.   He has been in the ICU and now has had worsening respiratory symptoms and has had repeat CT done which shows: New bilateral pleural effusions with some question of loculation of fluid adjacent to the medial upper lobe  chest.  On close review of the patient's history he has had no history of boils or furuncles or MRSA infection. His father was sick several weeks ago with what was thought to be an upper respiratory infection. He denies having suffered some sore throat. There is no history of recent contact with any one with group a strep infection of the throat her skin. The patient himself has not engaged in any activities that have caused trauma to him he has not had any exposure to soil or thrush or saltwater. He does have a dog at home but no other cats and has not been hunting.    Past Medical History  Diagnosis Date  . Medical history non-contributory     Past Surgical History  Procedure Laterality Date  . No past surgeries    ergies:   No Known Allergies   Medications: I have reviewed patients current medications as documented in Epic Anti-infectives   Start     Dose/Rate Route Frequency Ordered Stop   08/05/13 1300  imipenem-cilastatin (PRIMAXIN) 500 mg in sodium chloride 0.9 % 100 mL IVPB     500 mg 200 mL/hr over 30 Minutes Intravenous 4 times per day 08/05/13 1259     08/04/13 1012  vancomycin (VANCOCIN) 1,000 mg in sodium chloride 0.9 % 1,000 mL irrigation  Status:  Discontinued       As needed 08/04/13 1012 08/04/13 1035   08/04/13 0300  vancomycin (VANCOCIN) IVPB 1000 mg/200 mL  premix     1,000 mg 200 mL/hr over 60 Minutes Intravenous Every 8 hours 08/03/13 1931     08/03/13 1830  piperacillin-tazobactam (ZOSYN) IVPB 3.375 g  Status:  Discontinued     3.375 g 12.5 mL/hr over 240 Minutes Intravenous Every 8 hours 08/03/13 1654 08/05/13 1255   08/03/13 1800  vancomycin (VANCOCIN) IVPB 1000 mg/200 mL premix     1,000 mg 200 mL/hr over 60 Minutes Intravenous  Once 08/03/13 1654 08/03/13 1949   08/03/13 1800  clindamycin (CLEOCIN) IVPB 600 mg  Status:  Discontinued     600 mg 100 mL/hr over 30 Minutes Intravenous 4 times per day 08/03/13 1709 08/03/13 1716   08/03/13 1800  clindamycin  (CLEOCIN) IVPB 600 mg     600 mg 100 mL/hr over 30 Minutes Intravenous 4 times per day 08/03/13 1717        Social History:  reports that he has never smoked. He has never used smokeless tobacco. He reports that he does not drink alcohol or use illicit drugs.  History reviewed. No pertinent family history. Brother healthy, Father with recent URI  As in HPI and primary teams notes otherwise 12 point review of systems is negative  Blood pressure 111/75, pulse 101, temperature 97.9 F (36.6 C), temperature source Oral, resp. rate 40, height 5' 10"  (1.778 m), weight 179 lb 14.3 oz (81.6 kg), SpO2 91.00%. General: Alert and awake, oriented x3, constantly scratching his ears and around the face mask  HEENT: anicteric sclera,  EOMI, oropharynx clear and without exudate CVS tachycardic rate, normal r,  no murmur rubs or gallops Chest: anteriorly fairly clear auscultation bilaterally, diminished breath sounds at the bases bilaterally posteriorly no wheezing,  Abdomen: soft nontender, nondistended, normal bowel sounds, Extremities: no  clubbing or edema noted bilaterally Skin: Significant erythema involving the left axillary region as well as the chest wall where there is a wound vacuum in place. The area is indurated and tender to palpation his dark with a magic marker line see pictures  antereriorly    Posteriorly       Neuro: nonfocal, strength and sensation intact   Results for orders placed during the hospital encounter of 08/03/13 (from the past 48 hour(s))  GLUCOSE, CAPILLARY     Status: None   Collection Time    08/03/13  4:46 PM      Result Value Range   Glucose-Capillary 91  70 - 99 mg/dL  MRSA PCR SCREENING     Status: None   Collection Time    08/03/13  5:07 PM      Result Value Range   MRSA by PCR NEGATIVE  NEGATIVE   Comment:            The GeneXpert MRSA Assay (FDA     approved for NASAL specimens     only), is one component of a     comprehensive MRSA  colonization     surveillance program. It is not     intended to diagnose MRSA     infection nor to guide or     monitor treatment for     MRSA infections.  CBC     Status: Abnormal   Collection Time    08/03/13  6:40 PM      Result Value Range   WBC 21.4 (*) 4.0 - 10.5 K/uL   RBC 5.22  4.22 - 5.81 MIL/uL   Hemoglobin 16.2  13.0 - 17.0 g/dL   HCT 44.7  39.0 - 52.0 %   MCV 85.6  78.0 - 100.0 fL   MCH 31.0  26.0 - 34.0 pg   MCHC 36.2 (*) 30.0 - 36.0 g/dL   RDW 12.7  11.5 - 15.5 %   Platelets 176  150 - 400 K/uL  COMPREHENSIVE METABOLIC PANEL     Status: Abnormal   Collection Time    08/03/13  6:40 PM      Result Value Range   Sodium 123 (*) 137 - 147 mEq/L   Potassium 4.1  3.7 - 5.3 mEq/L   Chloride 85 (*) 96 - 112 mEq/L   CO2 18 (*) 19 - 32 mEq/L   Glucose, Bld 122 (*) 70 - 99 mg/dL   BUN 29 (*) 6 - 23 mg/dL   Creatinine, Ser 0.98  0.50 - 1.35 mg/dL   Calcium 7.8 (*) 8.4 - 10.5 mg/dL   Total Protein 5.6 (*) 6.0 - 8.3 g/dL   Albumin 1.8 (*) 3.5 - 5.2 g/dL   AST 39 (*) 0 - 37 U/L   ALT 15  0 - 53 U/L   Alkaline Phosphatase 68  39 - 117 U/L   Total Bilirubin 2.0 (*) 0.3 - 1.2 mg/dL   GFR calc non Af Amer >90  >90 mL/min   GFR calc Af Amer >90  >90 mL/min   Comment: (NOTE)     The eGFR has been calculated using the CKD EPI equation.     This calculation has not been validated in all clinical situations.     eGFR's persistently <90 mL/min signify possible Chronic Kidney     Disease.  MAGNESIUM     Status: None   Collection Time    08/03/13  6:40 PM      Result Value Range   Magnesium 2.1  1.5 - 2.5 mg/dL  PHOSPHORUS     Status: None   Collection Time    08/03/13  6:40 PM      Result Value Range   Phosphorus 3.7  2.3 - 4.6 mg/dL  LACTIC ACID, PLASMA     Status: Abnormal   Collection Time    08/03/13  6:40 PM      Result Value Range   Lactic Acid, Venous 3.6 (*) 0.5 - 2.2 mmol/L  PROCALCITONIN     Status: None   Collection Time    08/03/13  6:40 PM      Result  Value Range   Procalcitonin 27.51     Comment:            Interpretation:     PCT >= 10 ng/mL:     Important systemic inflammatory response,     almost exclusively due to severe bacterial     sepsis or septic shock.     (NOTE)             ICU PCT Algorithm               Non ICU PCT Algorithm        ----------------------------     ------------------------------             PCT < 0.25 ng/mL                 PCT < 0.1 ng/mL         Stopping of antibiotics            Stopping of antibiotics           strongly encouraged.  strongly encouraged.        ----------------------------     ------------------------------           PCT level decrease by               PCT < 0.25 ng/mL           >= 80% from peak PCT           OR PCT 0.25 - 0.5 ng/mL          Stopping of antibiotics                                                 encouraged.         Stopping of antibiotics               encouraged.        ----------------------------     ------------------------------           PCT level decrease by              PCT >= 0.25 ng/mL           < 80% from peak PCT            AND PCT >= 0.5 ng/mL            Continuing antibiotics                                                  encouraged.           Continuing antibiotics                encouraged.        ----------------------------     ------------------------------         PCT level increase compared          PCT > 0.5 ng/mL             with peak PCT AND              PCT >= 0.5 ng/mL             Escalation of antibiotics                                              strongly encouraged.          Escalation of antibiotics            strongly encouraged.  PROTIME-INR     Status: Abnormal   Collection Time    08/03/13  6:40 PM      Result Value Range   Prothrombin Time 16.3 (*) 11.6 - 15.2 seconds   INR 1.34  0.00 - 1.49  CULTURE, BLOOD (ROUTINE X 2)     Status: None   Collection Time    08/03/13  6:40 PM      Result Value Range    Specimen Description BLOOD RIGHT HAND     Special Requests BOTTLES DRAWN AEROBIC ONLY 4CC PT ON ZOSYN,VANC     Culture  Setup Time  Value: 08/03/2013 21:12     Performed at [facility]   Culture       Value:        BLOOD CULTURE RECEIVED NO GROWTH TO DATE CULTURE WILL BE HELD FOR 5 DAYS BEFORE ISSUING A FINAL NEGATIVE REPORT     Performed at [facility]   Report Status PENDING    CULTURE, BLOOD (ROUTINE X 2)     Status: None   Collection Time    08/03/13  6:46 PM      Result Value Range   Specimen Description BLOOD LEFT HAND     Special Requests BOTTLES DRAWN AEROBIC ONLY 1.5CC PT ON ZOSYN VANCO     Culture  Setup Time       Value: 08/03/2013 21:11     Performed at [facility]   Culture       Value:        BLOOD CULTURE RECEIVED NO GROWTH TO DATE CULTURE WILL BE HELD FOR 5 DAYS BEFORE ISSUING A FINAL NEGATIVE REPORT     Performed at [facility]   Report Status PENDING    GLUCOSE, CAPILLARY     Status: Abnormal   Collection Time    08/03/13  7:03 PM      Result Value Range   Glucose-Capillary 148 (*) 70 - 99 mg/dL  CORTISOL     Status: None   Collection Time    08/03/13  9:00 PM      Result Value Range   Cortisol, Plasma 27.8     Comment: (NOTE)     AM:  4.3 - 22.4 ug/dL     PM:  3.1 - 16.7 ug/dL     Performed at [facility]  LACTIC ACID, PLASMA     Status: Abnormal   Collection Time    08/03/13 10:00 PM      Result Value Range   Lactic Acid, Venous 2.9 (*) 0.5 - 2.2 mmol/L  CARBOXYHEMOGLOBIN     Status: None   Collection Time    08/04/13 12:02 AM      Result Value Range   Total hemoglobin 14.9  13.5 - 18.0 g/dL   O2 Saturation 77.0     Carboxyhemoglobin 1.3  0.5 - 1.5 %   Methemoglobin 1.3  0.0 - 1.5 %  TYPE AND SCREEN     Status: None   Collection Time    08/04/13  3:00 AM      Result Value Range   ABO/RH(D) O POS     Antibody Screen NEG     Sample Expiration 08/07/2013    URINALYSIS, ROUTINE W REFLEX  MICROSCOPIC     Status: Abnormal   Collection Time    08/04/13  3:45 AM      Result Value Range   Color, Urine AMBER (*) YELLOW   Comment: BIOCHEMICALS MAY BE AFFECTED BY COLOR   APPearance CLOUDY (*) CLEAR   Specific Gravity, Urine >1.046 (*) 1.005 - 1.030   pH 5.5  5.0 - 8.0   Glucose, UA NEGATIVE  NEGATIVE mg/dL   Hgb urine dipstick LARGE (*) NEGATIVE   Bilirubin Urine SMALL (*) NEGATIVE   Ketones, ur NEGATIVE  NEGATIVE mg/dL   Protein, ur 30 (*) NEGATIVE mg/dL   Urobilinogen, UA 2.0 (*) 0.0 - 1.0 mg/dL   Nitrite NEGATIVE  NEGATIVE   Leukocytes, UA NEGATIVE  NEGATIVE  URINE MICROSCOPIC-ADD ON     Status: None   Collection Time  08/04/13  3:45 AM      Result Value Range   WBC, UA 0-2  <3 WBC/hpf   RBC / HPF 7-10  <3 RBC/hpf   Bacteria, UA RARE  RARE   Urine-Other AMORPHOUS URATES/PHOSPHATES    CBC     Status: Abnormal   Collection Time    08/04/13  4:15 AM      Result Value Range   WBC 18.7 (*) 4.0 - 10.5 K/uL   RBC 4.26  4.22 - 5.81 MIL/uL   Hemoglobin 12.9 (*) 13.0 - 17.0 g/dL   Comment: DELTA CHECK NOTED     REPEATED TO VERIFY   HCT 37.0 (*) 39.0 - 52.0 %   MCV 86.9  78.0 - 100.0 fL   MCH 30.3  26.0 - 34.0 pg   MCHC 34.9  30.0 - 36.0 g/dL   RDW 12.9  11.5 - 15.5 %   Platelets 140 (*) 150 - 400 K/uL  BASIC METABOLIC PANEL     Status: Abnormal   Collection Time    08/04/13  4:15 AM      Result Value Range   Sodium 132 (*) 137 - 147 mEq/L   Comment: DELTA CHECK NOTED   Potassium 3.9  3.7 - 5.3 mEq/L   Chloride 100  96 - 112 mEq/L   Comment: DELTA CHECK NOTED   CO2 19  19 - 32 mEq/L   Glucose, Bld 131 (*) 70 - 99 mg/dL   BUN 29 (*) 6 - 23 mg/dL   Creatinine, Ser 1.17  0.50 - 1.35 mg/dL   Calcium 6.5 (*) 8.4 - 10.5 mg/dL   GFR calc non Af Amer 89 (*) >90 mL/min   GFR calc Af Amer >90  >90 mL/min   Comment: (NOTE)     The eGFR has been calculated using the CKD EPI equation.     This calculation has not been validated in all clinical situations.     eGFR's  persistently <90 mL/min signify possible Chronic Kidney     Disease.  CORTISOL-AM, BLOOD     Status: Abnormal   Collection Time    08/04/13  4:15 AM      Result Value Range   Cortisol - AM 37.1 (*) 4.3 - 22.4 ug/dL   Comment: Performed at [facility]  ABO/RH     Status: None   Collection Time    08/04/13  4:30 AM      Result Value Range   ABO/RH(D) O POS    HIV ANTIBODY (ROUTINE TESTING)     Status: None   Collection Time    08/04/13  7:30 AM      Result Value Range   HIV NON REACTIVE  NON REACTIVE   Comment: Performed at [place]     Status: None   Collection Time    08/04/13  9:52 AM      Result Value Range   Specimen Description ABSCESS CHEST LEFT     Special Requests LEFT CHEST WALL     Gram Stain       Value: ABUNDANT WBC PRESENT,BOTH PMN AND MONONUCLEAR     ABUNDANT GRAM POSITIVE COCCI     IN PAIRS IN CHAINS Performed at [facility] Gram Stain Report Called to,Read Back By and Verified With: Gram Stain Report Called to,Read Back By and Verified With: OR ON 08/04/13 AT 1130 BY K SCHULTZ     Performed at [facility]  Culture       Value: NO ANAEROBES ISOLATED; CULTURE IN PROGRESS FOR 5 DAYS     Performed at [facility]   Report Status PENDING    CULTURE, ROUTINE-ABSCESS     Status: None   Collection Time    08/04/13  9:52 AM      Result Value Range   Specimen Description ABSCESS CHEST LEFT     Special Requests LEFT CHEST WALL     Gram Stain       Value: RARE WBC PRESENT, PREDOMINANTLY PMN     FEW GRAM POSITIVE COCCI IN PAIRS     FEW GRAM POSITIVE COCCI IN CLUSTERS     Performed at [facility]   Culture       Value: Culture reincubated for better growth     Performed at [facility]   Report Status PENDING    GRAM STAIN     Status: None   Collection Time    08/04/13  9:52 AM      Result Value Range   Specimen Description ABSCESS CHEST LEFT     Special Requests LEFT CHEST WALL      Gram Stain       Value: ABUNDANT WBC PRESENT,BOTH PMN AND MONONUCLEAR     ABUNDANT GRAM POSITIVE COCCI IN PAIRS IN CHAINS     CALLED TO OR 08/04/13 1130 BY K SCHULTZ   Report Status 08/04/2013 FINAL    ANAEROBIC CULTURE     Status: None   Collection Time    08/04/13 10:07 AM      Result Value Range   Specimen Description TISSUE CHEST LEFT     Special Requests TISSUE FROM LEFT CHEST WALL ABSCESS     Gram Stain       Value: RARE WBC PRESENT, PREDOMINANTLY MONONUCLEAR     RARE GRAM POSITIVE COCCI     IN PAIRS IN CHAINS Performed at [place] ON 08/04/13 AT 6803 BY K SCHULTZ     Performed at [facility]   Culture       Value: NO ANAEROBES ISOLATED; CULTURE IN PROGRESS FOR 5 DAYS     Performed at [facility]   Report Status PENDING    TISSUE CULTURE     Status: None   Collection Time    08/04/13 10:07 AM      Result Value Range   Specimen Description TISSUE CHEST LEFT     Special Requests TISSUE FROM LEFT CHEST WALL ABSCESS     Gram Stain       Value: Performed at [facility] RARE WBC PRESENT,BOTH PMN AND MONONUCLEAR     RARE GRAM POSITIVE COCCI     IN PAIRS IN CHAINS Gram Stain Report Called to,Read Back By and Verified With: Gram Stain Report Called to,Read Back By and Verified With:  DR [name] ON 08/04/13 BY K SCHULTZ CORRECTED RESULTS CALLED TO: JAMES D ON 08/05/13 AT 0830 BY SMIAS     Performed at [facility]   Culture       Value: Culture reincubated for better growth     Performed at [place]     Status: None   Collection Time    08/04/13 10:07 AM      Result Value Range   Specimen Description TISSUE CHEST LEFT     Special Requests TISSUE FROM  LEFT CHEST WALL ABSCESS     Gram Stain       Value: RARE WBC PRESENT, PREDOMINANTLY MONONUCLEAR     RARE GRAM POSITIVE COCCI IN PAIRS IN CHAINS     CALLED TO DR [name] 08/04/13 1158 BY K SCHULTZ   Report Status  08/04/2013 FINAL    LACTIC ACID, PLASMA     Status: Abnormal   Collection Time    08/04/13  1:00 PM      Result Value Range   Lactic Acid, Venous 3.3 (*) 0.5 - 2.2 mmol/L  CBC     Status: Abnormal   Collection Time    08/05/13  4:47 AM      Result Value Range   WBC 29.6 (*) 4.0 - 10.5 K/uL   RBC 3.76 (*) 4.22 - 5.81 MIL/uL   Hemoglobin 11.2 (*) 13.0 - 17.0 g/dL   HCT 32.4 (*) 39.0 - 52.0 %   MCV 86.2  78.0 - 100.0 fL   MCH 29.8  26.0 - 34.0 pg   MCHC 34.6  30.0 - 36.0 g/dL   RDW 13.1  11.5 - 15.5 %   Platelets 142 (*) 150 - 400 K/uL  CARBOXYHEMOGLOBIN     Status: Abnormal   Collection Time    08/05/13 10:45 AM      Result Value Range   Total hemoglobin 10.5 (*) 13.5 - 18.0 g/dL   O2 Saturation 69.8     Carboxyhemoglobin 1.4  0.5 - 1.5 %   Methemoglobin 0.7  0.0 - 1.5 %      Component Value Date/Time   SDES TISSUE CHEST LEFT 08/04/2013 1007   SDES TISSUE CHEST LEFT 08/04/2013 1007   SDES TISSUE CHEST LEFT 08/04/2013 1007   SPECREQUEST TISSUE FROM LEFT CHEST WALL ABSCESS 08/04/2013 1007   SPECREQUEST TISSUE FROM LEFT CHEST WALL ABSCESS 08/04/2013 1007   SPECREQUEST TISSUE FROM LEFT CHEST WALL ABSCESS 08/04/2013 1007   CULT  Value: NO ANAEROBES ISOLATED; CULTURE IN PROGRESS FOR 5 DAYS Performed at [facility] 08/04/2013 1007   CULT  Value: Culture reincubated for better growth Performed at [place] 08/04/2013 1007   REPTSTATUS PENDING 08/04/2013 1007   REPTSTATUS PENDING 08/04/2013 1007   REPTSTATUS 08/04/2013 FINAL 08/04/2013 1007   Ct Chest W Contrast  08/05/2013   CLINICAL DATA:  Evaluate for infiltrates or empyema.  EXAM: CT CHEST WITH CONTRAST  TECHNIQUE: Multidetector CT imaging of the chest was performed during intravenous contrast administration.  CONTRAST:  2m OMNIPAQUE IOHEXOL 300 MG/ML  SOLN  COMPARISON:  CT scan August 03, 2013.  FINDINGS: On the prior study, there was an abscess reported in the deep left chest wall. This appears to have been evacuated  surgically. Is now replaced by a cavitary region filled with air and debris or possibly packing material. This communicates with the skin surface and there is 8 wound VAC over the area.  New from the prior study is a development of moderate bilateral pleural effusions with extensive underlying atelectasis. On the left, in the upper lobe medially, there appears to be a partially loculated component of the pleural effusion with thin enhancing rim. This measures 53 x 17 mm. The pleural effusions otherwise do not show evidence of loculation. In the there is no significant hilar or mediastinal adenopathy.  There are patchy peripheral nodular infiltrates in the aerated portion of the right upper and middle lobe. There are similar findings on the left.  There is diffuse subcutaneous soft tissue  increased attenuation consistent with anasarca.  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  IMPRESSION: 1. Postoperative change involving left chest wall abscess. 2. Interval development of fluid overload with anasarca and significant bilateral pleural effusion. There is apparent loculation of a component of the left pleural effusion involving the pleural surface medially at the left lung apex. Sterility uncertain. 3. Extensive passive atelectasis. Additionally the aerated portions of the lungs show patchy nodular peripheral infiltrates. These could represent areas of developing pulmonary edema as well as septic emboli. Radiographic followup recommended   Electronically Signed   By: Skipper Cliche M.D.   On: 08/05/2013 11:58   Ct Chest W Contrast  08/03/2013   CLINICAL DATA:  Increasing left upper chest pain and swelling for 4 days. Fever and nausea. Unable to raise left arm.  EXAM: CT CHEST and ABDOMEN WITHOUT CONTRAST  TECHNIQUE: Multidetector CT imaging of the chest and abdomen was performed following the standard protocol without IV contrast.  COMPARISON:  None available for  comparison at time of study interpretation.  FINDINGS: CT CHEST FINDINGS  Markedly edematous left anterior chest wall, with edema within the pectoralis muscle, there is a subpectoral as 6.6 x 2.7 cm fluid collection contiguous with the chest wall. Loss of the intramuscular fat planes. The interstitial edema extends posteriorly into the latissimus dorsi and anterior shoulder musculature. There is marked edema along the external oblique muscle. No subcutaneous gas. Mild inflammatory changes in and apparent thickening of the anterior left lung pleura, axial 39/106. Small left pleural effusion. No destructive bony lesions. Left subclavian vein appears patent though, not tailored for evaluation.  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 The heart is unremarkable. Equivocal anterior pericardial thickening. Small left greater than right pleural effusions without abscess, bibasilar atelectasis. Tracheobronchial tree is patent and midline. No pneumothorax.  CT ABDOMEN FINDINGS  The liver, spleen, pancreas, gallbladder and adrenal glands are unremarkable.  Stomach, included small and large bowel are normal in course and caliber without inflammatory changes. Normal appendix. No intraperitoneal free fluid or free air within the abdomen.  Kidneys are unremarkable. Great vessels are normal in course and caliber. Scattered thoracic Schmorl's nodes. 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  IMPRESSION: CT chest: 6.6 x 2.7 cm left anterior chest small abscess with markedly edema throughout the anterior lateral left chest wall, highly concerning for infectious process, early necrotizing fasciitis cannot be excluded.  Mild anterior left pleural thickening which may reflect reactive changes, and equivocal pericardial wall thickening, infectious involvement may have this appearance. Small pleural  effusions and atelectasis.  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  CT abdomen:  No acute intra-abdominal process.  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  Critical Value/emergent results were called by telephone at the time of interpretation on 08/03/2013 at 3:05 PM to Luna Glasgow, PA , who verbally acknowledged these results. Patient was instructed to return to Dr. Archie Endo office per [facility] request.   Electronically Signed   By: Elon Alas   On: 08/03/2013 15:27   Ct Abdomen W Contrast  08/03/2013   CLINICAL DATA:  Increasing left upper chest pain and swelling for 4 days. Fever and nausea. Unable to raise left arm.  EXAM: CT CHEST and ABDOMEN WITHOUT CONTRAST  TECHNIQUE: Multidetector CT imaging of the chest and abdomen was performed following the standard  protocol without IV contrast.  COMPARISON:  None available for comparison at time of study interpretation.  FINDINGS: CT CHEST FINDINGS  Markedly edematous left anterior chest wall, with edema within the pectoralis muscle, there is a subpectoral as 6.6 x 2.7 cm fluid collection contiguous with the chest wall. Loss of the intramuscular fat planes. The interstitial edema extends posteriorly into the latissimus dorsi and anterior shoulder musculature. There is marked edema along the external oblique muscle. No subcutaneous gas. Mild inflammatory changes in and apparent thickening of the anterior left lung pleura, axial 39/106. Small left pleural effusion. No destructive bony lesions. Left subclavian vein appears patent though, not tailored for evaluation.  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 The heart is unremarkable. Equivocal anterior pericardial thickening. Small left greater than right pleural effusions without abscess, bibasilar atelectasis. Tracheobronchial  tree is patent and midline. No pneumothorax.  CT ABDOMEN FINDINGS  The liver, spleen, pancreas, gallbladder and adrenal glands are unremarkable.  Stomach, included small and large bowel are normal in course and caliber without inflammatory changes. Normal appendix. No intraperitoneal free fluid or free air within the abdomen.  Kidneys are unremarkable. Great vessels are normal in course and caliber. Scattered thoracic Schmorl's nodes. 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  IMPRESSION: CT chest: 6.6 x 2.7 cm left anterior chest small abscess with markedly edema throughout the anterior lateral left chest wall, highly concerning for infectious process, early necrotizing fasciitis cannot be excluded.  Mild anterior left pleural thickening which may reflect reactive changes, and equivocal pericardial wall thickening, infectious involvement may have this appearance. Small pleural effusions and atelectasis.  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  CT abdomen:  No acute intra-abdominal process.  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  Critical Value/emergent results were called by telephone at the time of interpretation on 08/03/2013 at 3:05 PM to Luna Glasgow, PA , who verbally acknowledged these results. Patient was instructed to return to Dr. Archie Endo office per [facility] request.   Electronically Signed   By: Elon Alas   On: 08/03/2013 15:27   Dg Chest Port 1 View  08/05/2013   CLINICAL DATA:  Suspected left pleural effusion  EXAM: PORTABLE CHEST - 1 VIEW  COMPARISON:  Portable chest x-ray of August 04, 2013  FINDINGS: On the left the pulmonary interstitium has increased in conspicuity and further obscuration of the left hemidiaphragm has occurred. On the right there is new increased density in the perihilar and infrahilar region consistent with atelectasis. A  small amount of pleural fluid has accumulated on the right in addition to that known to be present on the left. The cardiopericardial silhouette is top-normal in size. The pulmonary vascularity is indistinct. There is a right internal jugular venous catheter in place whose tip lies in the region of the midportion of the SVC.  IMPRESSION: Increasing interstitial density within both lungs is consistent with atelectasis or pneumonia. Obscuration of the left hemidiaphragm is likely in part due to a small to moderate-sized pleural effusion. There is a new small right pleural effusion as well.   Electronically Signed   By: David  Martinique   On: 08/05/2013 09:15   Dg Chest Port 1 View  08/04/2013   CLINICAL DATA:  Preoperative evaluation for drainage of a chest abscess  EXAM: PORTABLE CHEST - 1 VIEW  COMPARISON:  Portable exam 0813 hr  compared to chest radiograph of 08/03/2013 and CT chest of 08/03/2013  FINDINGS: Tip of right jugular central venous catheter projects over SVC.  Minimal enlargement of cardiac silhouette.  Mediastinal contours and pulmonary vascularity normal.  Bronchitic changes.  Increased attenuation of the left hemi thorax corresponds to marked asymmetric chest wall soft tissue thickening identified on recent CT at site of chest wall abscess.  Small left pleural effusion is present.  No pneumothorax or acute osseous findings.  IMPRESSION: Mild enlargement of cardiac silhouette with small left pleural effusion   Electronically Signed   By: Lavonia Dana M.D.   On: 08/04/2013 08:20   Dg Chest Port 1 View  08/03/2013   CLINICAL DATA:  Lata placement.  EXAM: PORTABLE CHEST - 1 VIEW  COMPARISON:  CT chest 08/03/2013.  FINDINGS: The patient has a new right IJ approach central venous catheter. Tip of the catheter projects over the mid superior vena cava. There is no pneumothorax. Lungs are clear. Heart size is normal. Increased density over the left chest is consistent with chest wall abscess as seen on CT  scan.  IMPRESSION: Tip of right IJ catheter projects over the mid superior vena cava. No pneumothorax.  Increased density over the left chest is consistent with anterior chest wall abscess as seen on CT scan.   Electronically Signed   By: Inge Rise M.D.   On: 08/03/2013 21:40     Recent Results (from the past 720 hour(s))  MRSA PCR SCREENING     Status: None   Collection Time    08/03/13  5:07 PM      Result Value Range Status   MRSA by PCR NEGATIVE  NEGATIVE Final   Comment:            The GeneXpert MRSA Assay (FDA     approved for NASAL specimens     only), is one component of a     comprehensive MRSA colonization     surveillance program. It is not     intended to diagnose MRSA     infection nor to guide or     monitor treatment for     MRSA infections.  CULTURE, BLOOD (ROUTINE X 2)     Status: None   Collection Time    08/03/13  6:40 PM      Result Value Range Status   Specimen Description BLOOD RIGHT HAND   Final   Special Requests BOTTLES DRAWN AEROBIC ONLY 4CC PT ON [facility]   Final   Culture  Setup Time     Final   Value: 08/03/2013 21:12     Performed at [facility]   Culture     Final   Value:        BLOOD CULTURE RECEIVED NO GROWTH TO DATE CULTURE WILL BE HELD FOR 5 DAYS BEFORE ISSUING A FINAL NEGATIVE REPORT     Performed at [facility]   Report Status PENDING   Incomplete  CULTURE, BLOOD (ROUTINE X 2)     Status: None   Collection Time    08/03/13  6:46 PM      Result Value Range Status   Specimen Description BLOOD LEFT HAND   Final   Special Requests BOTTLES DRAWN AEROBIC ONLY 1.5CC PT ON ZOSYN VANCO   Final   Culture  Setup Time     Final   Value: 08/03/2013 21:11     Performed at [facility]  Final   Value:        BLOOD CULTURE RECEIVED NO GROWTH TO DATE CULTURE WILL BE HELD FOR 5 DAYS BEFORE ISSUING A FINAL NEGATIVE REPORT     Performed at [facility]   Report Status PENDING   Incomplete    ANAEROBIC CULTURE     Status: None   Collection Time    08/04/13  9:52 AM      Result Value Range Status   Specimen Description ABSCESS CHEST LEFT   Final   Special Requests LEFT CHEST WALL   Final   Gram Stain     Final   Value: ABUNDANT WBC PRESENT,BOTH PMN AND MONONUCLEAR     ABUNDANT GRAM POSITIVE COCCI     IN PAIRS IN CHAINS Performed at [facility] Gram Stain Report Called to,Read Back By and Verified With: Gram Stain Report Called to,Read Back By and Verified With: OR ON 08/04/13 AT 1130 BY K SCHULTZ     Performed at [facility]   Culture     Final   Value: NO ANAEROBES ISOLATED; CULTURE IN PROGRESS FOR 5 DAYS     Performed at [facility]   Report Status PENDING   Incomplete  CULTURE, ROUTINE-ABSCESS     Status: None   Collection Time    08/04/13  9:52 AM      Result Value Range Status   Specimen Description ABSCESS CHEST LEFT   Final   Special Requests LEFT CHEST WALL   Final   Gram Stain     Final   Value: RARE WBC PRESENT, PREDOMINANTLY PMN     FEW GRAM POSITIVE COCCI IN PAIRS     FEW GRAM POSITIVE COCCI IN CLUSTERS     Performed at [facility]   Culture     Final   Value: Culture reincubated for better growth     Performed at [facility]   Report Status PENDING   Incomplete  GRAM STAIN     Status: None   Collection Time    08/04/13  9:52 AM      Result Value Range Status   Specimen Description ABSCESS CHEST LEFT   Final   Special Requests LEFT CHEST WALL   Final   Gram Stain     Final   Value: ABUNDANT WBC PRESENT,BOTH PMN AND MONONUCLEAR     ABUNDANT GRAM POSITIVE COCCI IN PAIRS IN CHAINS     CALLED TO OR 08/04/13 1130 BY K SCHULTZ   Report Status 08/04/2013 FINAL   Final  ANAEROBIC CULTURE     Status: None   Collection Time    08/04/13 10:07 AM      Result Value Range Status   Specimen Description TISSUE CHEST LEFT   Final   Special Requests TISSUE FROM LEFT CHEST WALL ABSCESS   Final   Gram Stain     Final    Value: RARE WBC PRESENT, PREDOMINANTLY MONONUCLEAR     RARE GRAM POSITIVE COCCI     IN PAIRS IN CHAINS Performed at [place] ON 08/04/13 AT 6967 BY K SCHULTZ     Performed at [facility]   Culture     Final   Value: NO ANAEROBES ISOLATED; CULTURE IN PROGRESS FOR 5 DAYS     Performed at [facility]   Report Status PENDING   Incomplete  TISSUE CULTURE     Status: None   Collection Time  08/04/13 10:07 AM      Result Value Range Status   Specimen Description TISSUE CHEST LEFT   Final   Special Requests TISSUE FROM LEFT CHEST WALL ABSCESS   Final   Gram Stain     Final   Value: Performed at [facility] RARE WBC PRESENT,BOTH PMN AND MONONUCLEAR     RARE GRAM POSITIVE COCCI     IN PAIRS IN CHAINS Gram Stain Report Called to,Read Back By and Verified With: Gram Stain Report Called to,Read Back By and Verified With:  DR [name] ON 08/04/13 BY K SCHULTZ CORRECTED RESULTS CALLED TO: JAMES D ON 08/05/13 AT 0830 BY SMIAS     Performed at [facility]   Culture     Final   Value: Culture reincubated for better growth     Performed at [facility]   Report Status PENDING   Incomplete  GRAM STAIN     Status: None   Collection Time    08/04/13 10:07 AM      Result Value Range Status   Specimen Description TISSUE CHEST LEFT   Final   Special Requests TISSUE FROM LEFT CHEST WALL ABSCESS   Final   Gram Stain     Final   Value: RARE WBC PRESENT, PREDOMINANTLY MONONUCLEAR     RARE GRAM POSITIVE COCCI IN PAIRS IN CHAINS     CALLED TO DR [name] 08/04/13 1158 BY K SCHULTZ   Report Status 08/04/2013 FINAL   Final     Impression/Recommendation  23 year old man with severe chest wall infection with concern of extension into the thoracic cavity given his pleural effusions one of which appears loculated in the left medial upper lobe region.    #1 chest wall abscess with concern for necrotizing skin infection and potential  extension into the chest cavity given pleural effusions:  Culprit organism is most likely a group A strep group B strep or methicillin-resistant staph aureus or methicillin sensitive strep staph aureus. He is very broadly covered for these organisms currently with his current antimicrobial regimen of vancomycin clindamycin and Zosyn. I did broaden him further from Zosyn to imipenem in case no cardiomyopathy playing a role. The culture results however did not support Nocardia with gram-positive cocci being seen not gram-positive rods. I will narrow his antibiotics as culture data comes back in a definitive organism was found.  I agree with aggressive drainage of his pleural fluid and sampling to determine if this is indeed an empyema.  I also agreed may need further intervention from cardiothoracic surgery if the medial fluid collection is still highly concerning for a possible abscess that is loculated. I spent greater than 60 minutes with the patient including greater than 50% of time in face to face counsel of the patient and in coordination of their care.    #2 screening for sexual transmitted diseases: HIV was negative will check a hepatitis panel especially in light of his tattoos.   Thank you so much for this interesting consult  [place] for [place] [phone] (pager) [phone] (office) 08/05/2013, 1:35 PM  [place] 08/05/2013, 1:35 PM

## 2013-08-05 NOTE — Progress Notes (Signed)
ANTIBIOTIC CONSULT NOTE - FOLLOW UP  Pharmacy Consult for Vancomycin Indication: severe chest wall infection and empyema  No Known Allergies  Patient Measurements: Height: 5\' 10"  (177.8 cm) Weight: 179 lb 14.3 oz (81.6 kg) IBW/kg (Calculated) : 73 Adjusted Body Weight: n/a  Vital Signs: Temp: 98.3 F (36.8 C) (01/17 1551) Temp src: Oral (01/17 1551) BP: 100/64 mmHg (01/17 1800) Pulse Rate: 87 (01/17 1800) Intake/Output from previous day: 01/16 0701 - 01/17 0700 In: 3940.7 [I.V.:2890.7; IV Piggyback:1050] Out: 4150 [Urine:4150] Intake/Output from this shift:    Labs:  Recent Labs  08/03/13 1840 08/04/13 0415 08/05/13 0447  WBC 21.4* 18.7* 29.6*  HGB 16.2 12.9* 11.2*  PLT 176 140* 142*  CREATININE 0.98 1.17  --    Estimated Creatinine Clearance: 104 ml/min (by C-G formula based on Cr of 1.17).  Recent Labs  08/05/13 1809  VANCOTROUGH 19.8     Microbiology: Recent Results (from the past 720 hour(s))  MRSA PCR SCREENING     Status: None   Collection Time    08/03/13  5:07 PM      Result Value Range Status   MRSA by PCR NEGATIVE  NEGATIVE Final   Comment:            The GeneXpert MRSA Assay (FDA     approved for NASAL specimens     only), is one component of a     comprehensive MRSA colonization     surveillance program. It is not     intended to diagnose MRSA     infection nor to guide or     monitor treatment for     MRSA infections.  CULTURE, BLOOD (ROUTINE X 2)     Status: None   Collection Time    08/03/13  6:40 PM      Result Value Range Status   Specimen Description BLOOD RIGHT HAND   Final   Special Requests BOTTLES DRA[facility]   Final   Culture  Setup Time     Final   Value: 08/03/2013 21:12     [facility]formed [facility]   Culture     Final   Value:        BLOOD CULTURE RECEIVED NO GROWTH TO DATE CULTURE WILL BE HELD FOR 5 DAYS BEFORE ISSUING A FINAL NEGATIVE REPORT     Performed at [facility]    Report Status PENDING   Incomplete  CULTURE, BLOOD (ROUTINE X 2)     Status: None   Collection Time    08/03/13  6:46 PM      Result Value Range Status   Specimen Description BLOOD LEFT HAND   Final   Special Requests BOTTLES DRAWN AEROBIC ONLY 1.5CC PT ON ZOSYN VANCO   Final   Culture  Setup Time     Final   Value: 08/03/2013 21:11     Performed at [facility]   Culture     Final   Value:        BLOOD CULTURE RECEIVED NO GROWTH TO DATE CULTURE WILL BE HELD FOR 5 DAYS BEFORE ISSUING A FINAL NEGATIVE REPORT     Performed at [facility]   Report Status PENDING   Incomplete  ANAEROBIC CULTURE     Status: None   Collection Time    08/04/13  9:52 AM      Result Value Range Status   Specimen Description ABSCESS CHEST LEFT   Final  Special Requests LEFT CHEST WALL   Final   Gram Stain     Final   Value: ABUNDANT WBC PRESENT,BOTH PMN AND MONONUCLEAR     ABUNDANT GRAM POSITIVE COCCI     IN PAIRS IN CHAINS Performed at [facility] Gram Stain Report Called to,Read Back By and Verified With: Gram Stain Report Called to,Read Back By and Verified With: OR ON 08/04/13 AT 1130 BY K SCHULTZ     Performed at [facility]   Culture     Final   Value: NO ANAEROBES ISOLATED; CULTURE IN PROGRESS FOR 5 DAYS     Performed at [facility]   Report Status PENDING   Incomplete  CULTURE, ROUTINE-ABSCESS     Status: None   Collection Time    08/04/13  9:52 AM      Result Value Range Status   Specimen Description ABSCESS CHEST LEFT   Final   Special Requests LEFT CHEST WALL   Final   Gram Stain     Final   Value: RARE WBC PRESENT, PREDOMINANTLY PMN     FEW GRAM POSITIVE COCCI IN PAIRS     FEW GRAM POSITIVE COCCI IN CLUSTERS     Performed at [facility]   Culture     Final   Value: Culture reincubated for better growth     Performed at [facility]   Report Status PENDING   Incomplete  GRAM STAIN     Status: None   Collection Time     08/04/13  9:52 AM      Result Value Range Status   Specimen Description ABSCESS CHEST LEFT   Final   Special Requests LEFT CHEST WALL   Final   Gram Stain     Final   Value: ABUNDANT WBC PRESENT,BOTH PMN AND MONONUCLEAR     ABUNDANT GRAM POSITIVE COCCI IN PAIRS IN CHAINS     CALLED TO OR 08/04/13 1130 BY K SCHULTZ   Report Status 08/04/2013 FINAL   Final  ANAEROBIC CULTURE     Status: None   Collection Time    08/04/13 10:07 AM      Result Value Range Status   Specimen Description TISSUE CHEST LEFT   Final   Special Requests TISSUE FROM LEFT CHEST WALL ABSCESS   Final   Gram Stain     Final   Value: RARE WBC PRESENT, PREDOMINANTLY MONONUCLEAR     RARE GRAM POSITIVE COCCI     IN PAIRS IN CHAINS Performed at [facility] DR [name] ON 08/04/13 AT 1158 BY K SCHULTZ     Performed at [facility]   Culture     Final   Value: NO ANAEROBES ISOLATED; CULTURE IN PROGRESS FOR 5 DAYS     Performed at [facility]   Report Status PENDING   Incomplete  TISSUE CULTURE     Status: None   Collection Time    08/04/13 10:07 AM      Result Value Range Status   Specimen Description TISSUE CHEST LEFT   Final   Special Requests TISSUE FROM LEFT CHEST WALL ABSCESS   Final   Gram Stain     Final   Value: Performed at [facility] RARE WBC PRESENT,BOTH PMN AND MONONUCLEAR     RARE GRAM POSITIVE COCCI     IN PAIRS IN CHAINS Gram Stain Report Called to,Read Back By and Verified With: Gram Stain Report Called  to,Read Back By and Verified With:  DR [name] ON 08/04/13 BY K SCHULTZ CORRECTED RESULTS CALLED TO: JAMES D ON 08/05/13 AT 0830 BY SMIAS     Performed at [facility]   Culture     Final   Value: Culture reincubated for better growth     Performed at [facility]   Report Status PENDING   Incomplete  GRAM STAIN     Status: None   Collection Time    08/04/13 10:07 AM      Result Value Range Status   Specimen Description TISSUE CHEST LEFT   Final    Special Requests TISSUE FROM LEFT CHEST WALL ABSCESS   Final   Gram Stain     Final   Value: RARE WBC PRESENT, PREDOMINANTLY MONONUCLEAR     RARE GRAM POSITIVE COCCI IN PAIRS IN CHAINS     CALLED TO DR [name] 08/04/13 1158 BY K SCHULTZ   Report Status 08/04/2013 FINAL   Final  GRAM STAIN     Status: None   Collection Time    08/05/13  1:50 PM      Result Value Range Status   Specimen Description PLEURAL   Final   Special Requests NONE   Final   Gram Stain     Final   Value: CYTOSPIN SLIDE     WBC PRESENT, PREDOMINANTLY PMN     GRAM POSITIVE COCCI IN PAIRS     CALLED TO DUNLAP,J RN 08/05/13 1754 WOOTEN,K   Report Status 08/05/2013 FINAL   Final    Anti-infectives   Start     Dose/Rate Route Frequency Ordered Stop   08/05/13 1300  imipenem-cilastatin (PRIMAXIN) 500 mg in sodium chloride 0.9 % 100 mL IVPB     500 mg 200 mL/hr over 30 Minutes Intravenous 4 times per day 08/05/13 1259     08/04/13 1012  vancomycin (VANCOCIN) 1,000 mg in sodium chloride 0.9 % 1,000 mL irrigation  Status:  Discontinued       As needed 08/04/13 1012 08/04/13 1035   08/04/13 0300  vancomycin (VANCOCIN) IVPB 1000 mg/200 mL premix     1,000 mg 200 mL/hr over 60 Minutes Intravenous Every 8 hours 08/03/13 1931     08/03/13 1830  piperacillin-tazobactam (ZOSYN) IVPB 3.375 g  Status:  Discontinued     3.375 g 12.5 mL/hr over 240 Minutes Intravenous Every 8 hours 08/03/13 1654 08/05/13 1255   08/03/13 1800  vancomycin (VANCOCIN) IVPB 1000 mg/200 mL premix     1,000 mg 200 mL/hr over 60 Minutes Intravenous  Once 08/03/13 1654 08/03/13 1949   08/03/13 1800  clindamycin (CLEOCIN) IVPB 600 mg  Status:  Discontinued     600 mg 100 mL/hr over 30 Minutes Intravenous 4 times per day 08/03/13 1709 08/03/13 1716   08/03/13 1800  clindamycin (CLEOCIN) IVPB 600 mg     600 mg 100 mL/hr over 30 Minutes Intravenous 4 times per day 08/03/13 1717        Assessment: 23 yr old male admitted with severe chest wall  infection, s/p I&D yesterday.  Currently on impenem and vancomycin.  Vancomycin trough therapeutic at 19.8.  Had one dose early this AM given a little late, next dose given on schedule.  Trough may be a little artificially elevated, but expect true level > 15.  Cultures growing GPC in pairs.  Goal of Therapy:  Vancomycin trough level 15-20 mcg/ml  Plan:  1. Continue vancomycin 1g IV q  8 hrs. 2. Will recheck trough in a few days. 3. Continue to follow culture results. 4. Watch renal function.  Tad Moore, BCPS  Clinical Pharmacist Pager [phone]  08/05/2013 7:35 PM

## 2013-08-05 NOTE — Progress Notes (Signed)
ANTIBIOTIC CONSULT NOTE - Follow-Up  Pharmacy Consult for Vancomycin and Zosyn- change to Primaxin Indication: large L chest wall abscess with gaseous formation  No Known Allergies  Patient Measurements: Height: 5\' 10"  (177.8 cm) Weight: 179 lb 14.3 oz (81.6 kg) IBW/kg (Calculated) : 73 Adjusted Body Weight:   Vital Signs: Temp: 97.9 F (36.6 C) (01/17 0810) Temp src: Oral (01/17 0810) BP: 111/75 mmHg (01/17 1200) Pulse Rate: 101 (01/17 1200) Intake/Output from previous day: 01/16 0701 - 01/17 0700 In: 3937.1 [I.V.:2887.1; IV Piggyback:1050] Out: 4150 [Urine:4150] Intake/Output from this shift: Total I/O In: 97.5 [I.V.:97.5] Out: 635 [Urine:635]  Labs:  Recent Labs  08/03/13 1840 08/04/13 0415 08/05/13 0447  WBC 21.4* 18.7* 29.6*  HGB 16.2 12.9* 11.2*  PLT 176 140* 142*  CREATININE 0.98 1.17  --    Estimated Creatinine Clearance: 104 ml/min (by C-G formula based on Cr of 1.17). No results found for this basename: VANCOTROUGH, Leodis BinetVANCOPEAK, VANCORANDOM, GENTTROUGH, GENTPEAK, GENTRANDOM, TOBRATROUGH, TOBRAPEAK, TOBRARND, AMIKACINPEAK, AMIKACINTROU, AMIKACIN,  in the last 72 hours   Microbiology: Recent Results (from the past 720 hour(s))  MRSA PCR SCREENING     Status: None   Collection Time    08/03/13  5:07 PM      Result Value Range Status   MRSA by PCR NEGATIVE  NEGATIVE Final   Comment:            The GeneXpert MRSA Assay (FDA     approved for NASAL specimens     only), is one component of a     comprehensive MRSA colonization     surveillance program. It is not     intended to diagnose MRSA     infection nor to guide or     monitor treatment for     MRSA infections.  CULTURE, BLOOD (ROUTINE X 2)     Status: None   Collection Time    08/03/13  6:40 PM      Result Value Range Status   Specimen Description BLOOD RIGHT HAND   Final   Special Requests BOTTLES DRAWN AEROBIC ONLY [facility]   Final   Culture  Setup Time     Final   Value:  08/03/2013 21:12     Performed at [facility]   Culture     Final   Value:        BLOOD CULTURE RECEIVED NO GROWTH TO DATE CULTURE WILL BE HELD FOR 5 DAYS BEFORE ISSUING A FINAL NEGATIVE REPORT     Performed at [facility]   Report Status PENDING   Incomplete  CULTURE, BLOOD (ROUTINE X 2)     Status: None   Collection Time    08/03/13  6:46 PM      Result Value Range Status   Specimen Description BLOOD LEFT HAND   Final   Special Requests BOTTLES DRAWN AEROBIC ONLY 1.5CC PT ON ZOSYN VANCO   Final   Culture  Setup Time     Final   Value: 08/03/2013 21:11     Performed at [facility]   Culture     Final   Value:        BLOOD CULTURE RECEIVED NO GROWTH TO DATE CULTURE WILL BE HELD FOR 5 DAYS BEFORE ISSUING A FINAL NEGATIVE REPORT     Performed at [facility]   Report Status PENDING   Incomplete  ANAEROBIC CULTURE     Status: None   Collection Time  08/04/13  9:52 AM      Result Value Range Status   Specimen Description ABSCESS CHEST LEFT   Final   Special Requests LEFT CHEST WALL   Final   Gram Stain     Final   Value: ABUNDANT WBC PRESENT,BOTH PMN AND MONONUCLEAR     ABUNDANT GRAM POSITIVE COCCI     IN PAIRS IN CHAINS Performed at [facility] Gram Stain Report Called to,Read Back By and Verified With: Gram Stain Report Called to,Read Back By and Verified With: OR ON 08/04/13 AT 1130 BY K SCHULTZ     Performed at [facility]   Culture     Final   Value: NO ANAEROBES ISOLATED; CULTURE IN PROGRESS FOR 5 DAYS     Performed at [facility]   Report Status PENDING   Incomplete  CULTURE, ROUTINE-ABSCESS     Status: None   Collection Time    08/04/13  9:52 AM      Result Value Range Status   Specimen Description ABSCESS CHEST LEFT   Final   Special Requests LEFT CHEST WALL   Final   Gram Stain     Final   Value: RARE WBC PRESENT, PREDOMINANTLY PMN     FEW GRAM POSITIVE COCCI IN PAIRS     FEW GRAM POSITIVE COCCI IN  CLUSTERS     Performed at [facility]   Culture     Final   Value: Culture reincubated for better growth     Performed at [facility]   Report Status PENDING   Incomplete  GRAM STAIN     Status: None   Collection Time    08/04/13  9:52 AM      Result Value Range Status   Specimen Description ABSCESS CHEST LEFT   Final   Special Requests LEFT CHEST WALL   Final   Gram Stain     Final   Value: ABUNDANT WBC PRESENT,BOTH PMN AND MONONUCLEAR     ABUNDANT GRAM POSITIVE COCCI IN PAIRS IN CHAINS     CALLED TO OR 08/04/13 1130 BY K SCHULTZ   Report Status 08/04/2013 FINAL   Final  ANAEROBIC CULTURE     Status: None   Collection Time    08/04/13 10:07 AM      Result Value Range Status   Specimen Description TISSUE CHEST LEFT   Final   Special Requests TISSUE FROM LEFT CHEST WALL ABSCESS   Final   Gram Stain     Final   Value: RARE WBC PRESENT, PREDOMINANTLY MONONUCLEAR     RARE GRAM POSITIVE COCCI     IN PAIRS IN CHAINS Performed at [facility] DR [name] ON 08/04/13 AT 1158 BY K SCHULTZ     Performed at [facility]   Culture     Final   Value: NO ANAEROBES ISOLATED; CULTURE IN PROGRESS FOR 5 DAYS     Performed at [facility]   Report Status PENDING   Incomplete  TISSUE CULTURE     Status: None   Collection Time    08/04/13 10:07 AM      Result Value Range Status   Specimen Description TISSUE CHEST LEFT   Final   Special Requests TISSUE FROM LEFT CHEST WALL ABSCESS   Final   Gram Stain     Final   Value: Performed at [facility] RARE WBC PRESENT,BOTH PMN AND MONONUCLEAR     RARE  GRAM POSITIVE COCCI     IN PAIRS IN CHAINS Gram Stain Report Called to,Read Back By and Verified With: Gram Stain Report Called to,Read Back By and Verified With:  DR [name] ON 08/04/13 BY K SCHULTZ CORRECTED RESULTS CALLED TO: JAMES D ON 08/05/13 AT 0830 BY SMIAS     Performed at [facility]   Culture     Final   Value: Culture reincubated  for better growth     Performed at [facility]   Report Status PENDING   Incomplete  GRAM STAIN     Status: None   Collection Time    08/04/13 10:07 AM      Result Value Range Status   Specimen Description TISSUE CHEST LEFT   Final   Special Requests TISSUE FROM LEFT CHEST WALL ABSCESS   Final   Gram Stain     Final   Value: RARE WBC PRESENT, PREDOMINANTLY MONONUCLEAR     RARE GRAM POSITIVE COCCI IN PAIRS IN CHAINS     CALLED TO DR [name] 08/04/13 1158 BY K SCHULTZ   Report Status 08/04/2013 FINAL   Final    Medical History: Past Medical History  Diagnosis Date  . Medical history non-contributory     Medications:  Scheduled:  . clindamycin (CLEOCIN) IV  600 mg Intravenous Q6H  . enoxaparin (LOVENOX) injection  40 mg Subcutaneous Q24H  . feeding supplement (ENSURE COMPLETE)  237 mL Oral TID BM  . hydrocortisone sodium succinate  50 mg Intravenous Q6H  . influenza vac split quadrivalent PF  0.5 mL Intramuscular Tomorrow-1000  . pantoprazole (PROTONIX) IV  40 mg Intravenous Q24H  . vancomycin  1,000 mg Intravenous Q8H   Infusions:  . sodium chloride 10 mL/hr at 08/04/13 0400  . sodium chloride 50 mL/hr at 08/05/13 0915  . norepinephrine (LEVOPHED) Adult infusion Stopped (08/05/13 0915)   Assessment: 23 yo male with no past medical history will be started on vancomycin and zosyn for large L chest wall abscess with gaseous formation.  Patient is also on clindamycin. Pharmacy asked to change Zosyn to Primaxin today.   Goal of Therapy:  Vancomycin trough level 15-20 mcg/ml  Plan:  1) Primaxin 500 mg IV q 6 hrs 2) Continue vancomycin 1g IV q 8 hrs, will check trough level tonight. 3) Monitor renal function.  Tad Moore, BCPS  Clinical Pharmacist Pager [phone]  08/05/2013 12:59 PM

## 2013-08-05 NOTE — Progress Notes (Signed)
Name: Olam IdlerJoshua Huntsman MRN: [id number] DOB: [birth year]  ELECTRONIC ICU PHYSICIAN NOTE  Problem:  Notified gm stain pos for gpc from L thoracentesis  Intervention:  Discussed with Dr Cornelius Moraswen, will consider vats, re check cxr in am 1/18  Sandrea HughsMichael Gina Costilla 08/05/2013, 7:56 PM

## 2013-08-06 ENCOUNTER — Encounter (HOSPITAL_COMMUNITY): Payer: Self-pay | Admitting: Certified Registered Nurse Anesthetist

## 2013-08-06 ENCOUNTER — Inpatient Hospital Stay (HOSPITAL_COMMUNITY): Payer: Managed Care, Other (non HMO) | Admitting: Anesthesiology

## 2013-08-06 ENCOUNTER — Inpatient Hospital Stay (HOSPITAL_COMMUNITY): Payer: Managed Care, Other (non HMO)

## 2013-08-06 ENCOUNTER — Encounter (HOSPITAL_COMMUNITY): Payer: Managed Care, Other (non HMO) | Admitting: Anesthesiology

## 2013-08-06 ENCOUNTER — Encounter (HOSPITAL_COMMUNITY): Payer: Self-pay | Admitting: Anesthesiology

## 2013-08-06 ENCOUNTER — Encounter (HOSPITAL_COMMUNITY)
Admission: AD | Disposition: A | Discharge status: Home Health | Payer: Self-pay | Source: Ambulatory Visit | Attending: Pulmonary Disease

## 2013-08-06 DIAGNOSIS — A419 Sepsis, unspecified organism: Secondary | ICD-10-CM | POA: Diagnosis present

## 2013-08-06 DIAGNOSIS — E43 Unspecified severe protein-calorie malnutrition: Secondary | ICD-10-CM

## 2013-08-06 DIAGNOSIS — A400 Sepsis due to streptococcus, group A: Secondary | ICD-10-CM | POA: Diagnosis present

## 2013-08-06 DIAGNOSIS — A409 Streptococcal sepsis, unspecified: Principal | ICD-10-CM

## 2013-08-06 DIAGNOSIS — J869 Pyothorax without fistula: Secondary | ICD-10-CM

## 2013-08-06 DIAGNOSIS — B950 Streptococcus, group A, as the cause of diseases classified elsewhere: Secondary | ICD-10-CM

## 2013-08-06 HISTORY — PX: VIDEO ASSISTED THORACOSCOPY (VATS)/EMPYEMA: SHX6172

## 2013-08-06 LAB — COMPREHENSIVE METABOLIC PANEL
ALT: 31 U/L (ref 0–53)
AST: 81 U/L — ABNORMAL HIGH (ref 0–37)
Albumin: 1.3 g/dL — ABNORMAL LOW (ref 3.5–5.2)
Alkaline Phosphatase: 78 U/L (ref 39–117)
BUN: 24 mg/dL — AB (ref 6–23)
CO2: 27 meq/L (ref 19–32)
Calcium: 7.2 mg/dL — ABNORMAL LOW (ref 8.4–10.5)
Chloride: 96 mEq/L (ref 96–112)
Creatinine, Ser: 0.9 mg/dL (ref 0.50–1.35)
GFR calc non Af Amer: >90 mL/min (ref >90)
GLUCOSE: 128 mg/dL — AB (ref 70–99)
POTASSIUM: 3 meq/L — AB (ref 3.7–5.3)
Sodium: 134 mEq/L — ABNORMAL LOW (ref 137–147)
TOTAL PROTEIN: 4.8 g/dL — AB (ref 6.0–8.3)
Total Bilirubin: 1.1 mg/dL (ref 0.3–1.2)

## 2013-08-06 LAB — HEPATITIS PANEL, ACUTE
HCV AB: NEGATIVE
Hep A IgM: NONREACTIVE
Hep B C IgM: NONREACTIVE
Hepatitis B Surface Ag: NEGATIVE

## 2013-08-06 LAB — CBC WITH DIFFERENTIAL/PLATELET
BASOS ABS: 0 10*3/uL (ref 0.0–0.1)
Basophils Relative: 0 % (ref 0–1)
Eosinophils Absolute: 0 10*3/uL (ref 0.0–0.7)
Eosinophils Relative: 0 % (ref 0–5)
HEMATOCRIT: 29.1 % — AB (ref 39.0–52.0)
Hemoglobin: 10.2 g/dL — ABNORMAL LOW (ref 13.0–17.0)
Lymphocytes Relative: 6 % — ABNORMAL LOW (ref 12–46)
Lymphs Abs: 1.3 10*3/uL (ref 0.7–4.0)
MCH: 29.6 pg (ref 26.0–34.0)
MCHC: 35.1 g/dL (ref 30.0–36.0)
MCV: 84.3 fL (ref 78.0–100.0)
MONOS PCT: 3 % (ref 3–12)
Monocytes Absolute: 0.6 10*3/uL (ref 0.1–1.0)
NEUTROS ABS: 19.3 10*3/uL — AB (ref 1.7–7.7)
Neutrophils Relative %: 91 % — ABNORMAL HIGH (ref 43–77)
PLATELETS: 128 10*3/uL — AB (ref 150–400)
RBC: 3.45 MIL/uL — ABNORMAL LOW (ref 4.22–5.81)
RDW: 13.3 % (ref 11.5–15.5)
WBC: 21.2 10*3/uL — AB (ref 4.0–10.5)

## 2013-08-06 LAB — PH, BODY FLUID: PH, FLUID: 8

## 2013-08-06 SURGERY — VIDEO ASSISTED THORACOSCOPY (VATS)/EMPYEMA
Anesthesia: General | Laterality: Left

## 2013-08-06 MED ORDER — SUCCINYLCHOLINE CHLORIDE 20 MG/ML IJ SOLN
INTRAMUSCULAR | Status: DC | PRN
  Administered 2013-08-06: 100 mg via INTRAVENOUS

## 2013-08-06 MED ORDER — 0.9 % SODIUM CHLORIDE (POUR BTL) OPTIME
TOPICAL | Status: DC | PRN
  Administered 2013-08-06: 1000 mL

## 2013-08-06 MED ORDER — CEFUROXIME SODIUM 1.5 G IJ SOLR
1.5000 g | INTRAMUSCULAR | Status: DC
  Filled 2013-08-06: qty 1.5

## 2013-08-06 MED ORDER — ROCURONIUM BROMIDE 100 MG/10ML IV SOLN
INTRAVENOUS | Status: DC | PRN
  Administered 2013-08-06: 20 mg via INTRAVENOUS
  Administered 2013-08-06: 10 mg via INTRAVENOUS

## 2013-08-06 MED ORDER — FENTANYL CITRATE 0.05 MG/ML IJ SOLN
INTRAMUSCULAR | Status: DC | PRN
  Administered 2013-08-06: 50 ug via INTRAVENOUS
  Administered 2013-08-06: 100 ug via INTRAVENOUS
  Administered 2013-08-06 (×2): 125 ug via INTRAVENOUS
  Administered 2013-08-06: 100 ug via INTRAVENOUS

## 2013-08-06 MED ORDER — ONDANSETRON HCL 4 MG/2ML IJ SOLN: 4.0000 mg | Freq: Once | INTRAMUSCULAR | Status: DC | PRN

## 2013-08-06 MED ORDER — MORPHINE SULFATE 2 MG/ML IJ SOLN: 2.0000 mg | INTRAMUSCULAR | Status: DC | PRN

## 2013-08-06 MED ORDER — POTASSIUM CHLORIDE 10 MEQ/50ML IV SOLN: 10.0000 meq | Freq: Every day | INTRAVENOUS | Status: DC | PRN

## 2013-08-06 MED ORDER — LIDOCAINE HCL 4 % MT SOLN
OROMUCOSAL | Status: DC | PRN
  Administered 2013-08-06: 4 mL via TOPICAL

## 2013-08-06 MED ORDER — LACTATED RINGERS IV SOLN
INTRAVENOUS | Status: DC | PRN
  Administered 2013-08-06: 10:00:00 via INTRAVENOUS

## 2013-08-06 MED ORDER — HYDROMORPHONE HCL PF 1 MG/ML IJ SOLN
0.2500 mg | INTRAMUSCULAR | Status: DC | PRN
  Administered 2013-08-06 (×4): 0.5 mg via INTRAVENOUS

## 2013-08-06 MED ORDER — ACETAMINOPHEN 500 MG PO TABS
1000.0000 mg | ORAL_TABLET | Freq: Four times a day (QID) | ORAL | Status: AC
  Administered 2013-08-06 (×2): 1000 mg via ORAL
  Filled 2013-08-06 (×2): qty 2

## 2013-08-06 MED ORDER — POTASSIUM CHLORIDE CRYS ER 20 MEQ PO TBCR
40.0000 meq | EXTENDED_RELEASE_TABLET | Freq: Two times a day (BID) | ORAL | Status: AC
  Administered 2013-08-06: 40 meq via ORAL
  Filled 2013-08-06: qty 2

## 2013-08-06 MED ORDER — ONDANSETRON HCL 4 MG/2ML IJ SOLN: 4.0000 mg | Freq: Four times a day (QID) | INTRAMUSCULAR | Status: DC | PRN

## 2013-08-06 MED ORDER — POTASSIUM CHLORIDE 10 MEQ/50ML IV SOLN
10.0000 meq | INTRAVENOUS | Status: AC
  Administered 2013-08-06 (×5): 10 meq via INTRAVENOUS

## 2013-08-06 MED ORDER — OXYCODONE HCL 5 MG PO TABS
5.0000 mg | ORAL_TABLET | ORAL | Status: DC | PRN
  Administered 2013-08-06 – 2013-08-09 (×11): 10 mg via ORAL
  Filled 2013-08-06 (×11): qty 2

## 2013-08-06 MED ORDER — LIDOCAINE HCL (CARDIAC) 20 MG/ML IV SOLN
INTRAVENOUS | Status: DC | PRN
  Administered 2013-08-06: 50 mg via INTRAVENOUS

## 2013-08-06 MED ORDER — ACETAMINOPHEN 160 MG/5ML PO SOLN
1000.0000 mg | Freq: Four times a day (QID) | ORAL | Status: AC
  Administered 2013-08-07 (×2): 1000 mg via ORAL
  Filled 2013-08-06 (×4): qty 40

## 2013-08-06 MED ORDER — PROPOFOL 10 MG/ML IV BOLUS
INTRAVENOUS | Status: DC | PRN
  Administered 2013-08-06: 150 mg via INTRAVENOUS

## 2013-08-06 MED ORDER — ONDANSETRON HCL 4 MG/2ML IJ SOLN
INTRAMUSCULAR | Status: DC | PRN
  Administered 2013-08-06: 4 mg via INTRAVENOUS

## 2013-08-06 MED ORDER — HYDROMORPHONE HCL PF 1 MG/ML IJ SOLN
INTRAMUSCULAR | Status: AC
  Administered 2013-08-06: 0.5 mg via INTRAVENOUS
  Filled 2013-08-06: qty 1

## 2013-08-06 MED ORDER — SODIUM CHLORIDE 0.9 % IV SOLN
3.0000 g | Freq: Four times a day (QID) | INTRAVENOUS | Status: DC
  Administered 2013-08-06 – 2013-08-12 (×24): 3 g via INTRAVENOUS
  Filled 2013-08-06 (×32): qty 3

## 2013-08-06 MED ORDER — POTASSIUM CHLORIDE 10 MEQ/50ML IV SOLN
INTRAVENOUS | Status: AC
Start: 2013-08-06 — End: 2013-08-06
  Filled 2013-08-06: qty 250

## 2013-08-06 SURGICAL SUPPLY — 70 items
APPLICATOR TIP EXT COSEAL (VASCULAR PRODUCTS) IMPLANT
APPLIER CLIP ROT 10 11.4 M/L (STAPLE)
CANISTER SUCTION 2500CC (MISCELLANEOUS) ×2 IMPLANT
CATH KIT ON Q 5IN SLV (PAIN MANAGEMENT) IMPLANT
CATH THORACIC 28FR (CATHETERS) IMPLANT
CATH THORACIC 36FR (CATHETERS) IMPLANT
CATH THORACIC 36FR RT ANG (CATHETERS) IMPLANT
CLEANER TIP ELECTROSURG 2X2 (MISCELLANEOUS) ×2 IMPLANT
CLIP APPLIE ROT 10 11.4 M/L (STAPLE) IMPLANT
CLIP TI MEDIUM 6 (CLIP) ×2 IMPLANT
CONN ST 1/4X3/8  BEN (MISCELLANEOUS) ×4
CONN ST 1/4X3/8 BEN (MISCELLANEOUS) ×4 IMPLANT
CONN Y 3/8X3/8X3/8  BEN (MISCELLANEOUS) ×2
CONN Y 3/8X3/8X3/8 BEN (MISCELLANEOUS) ×2 IMPLANT
CONT SPEC 4OZ CLIKSEAL STRL BL (MISCELLANEOUS) ×4 IMPLANT
COVER SURGICAL LIGHT HANDLE (MISCELLANEOUS) ×4 IMPLANT
DERMABOND ADVANCED (GAUZE/BANDAGES/DRESSINGS) ×1
DERMABOND ADVANCED .7 DNX12 (GAUZE/BANDAGES/DRESSINGS) ×1 IMPLANT
DRAIN CHANNEL 32F RND 10.7 FF (WOUND CARE) ×6 IMPLANT
DRAPE LAPAROSCOPIC ABDOMINAL (DRAPES) ×2 IMPLANT
DRAPE WARM FLUID 44X44 (DRAPE) ×4 IMPLANT
DRILL BIT 7/64X5 (BIT) IMPLANT
DRSG PAD ABDOMINAL 8X10 ST (GAUZE/BANDAGES/DRESSINGS) ×2 IMPLANT
ELECT REM PT RETURN 9FT ADLT (ELECTROSURGICAL) ×2
ELECTRODE REM PT RTRN 9FT ADLT (ELECTROSURGICAL) ×1 IMPLANT
FLUID NSS /IRRIG 3000 ML XXX (IV SOLUTION) ×2 IMPLANT
GLOVE BIOGEL M STER SZ 6 (GLOVE) ×4 IMPLANT
GLOVE BIOGEL PI IND STRL 6 (GLOVE) ×2 IMPLANT
GLOVE BIOGEL PI INDICATOR 6 (GLOVE) ×2
GLOVE EUDERMIC 7 POWDERFREE (GLOVE) ×4 IMPLANT
GLOVE ORTHO TXT STRL SZ7.5 (GLOVE) ×4 IMPLANT
GLOVE SURG SS PI 6.0 STRL IVOR (GLOVE) ×2 IMPLANT
GOWN PREVENTION PLUS XLARGE (GOWN DISPOSABLE) ×2 IMPLANT
GOWN STRL NON-REIN LRG LVL3 (GOWN DISPOSABLE) ×4 IMPLANT
KIT BASIN OR (CUSTOM PROCEDURE TRAY) ×2 IMPLANT
KIT ROOM TURNOVER OR (KITS) ×2 IMPLANT
NS IRRIG 1000ML POUR BTL (IV SOLUTION) ×4 IMPLANT
PACK CHEST (CUSTOM PROCEDURE TRAY) ×2 IMPLANT
PAD ARMBOARD 7.5X6 YLW CONV (MISCELLANEOUS) ×4 IMPLANT
SEALANT PROGEL (MISCELLANEOUS) IMPLANT
SEALANT SURG COSEAL 4ML (VASCULAR PRODUCTS) IMPLANT
SEALANT SURG COSEAL 8ML (VASCULAR PRODUCTS) IMPLANT
SET IRRIG TUBING LAPAROSCOPIC (IRRIGATION / IRRIGATOR) ×2 IMPLANT
SOLUTION ANTI FOG 6CC (MISCELLANEOUS) ×2 IMPLANT
SPONGE GAUZE 4X4 12PLY (GAUZE/BANDAGES/DRESSINGS) ×2 IMPLANT
SPONGE GAUZE 4X4 12PLY STER LF (GAUZE/BANDAGES/DRESSINGS) ×4 IMPLANT
SUT MNCRL AB 3-0 PS2 18 (SUTURE) ×2 IMPLANT
SUT PROLENE 3 0 SH DA (SUTURE) IMPLANT
SUT PROLENE 4 0 RB 1 (SUTURE)
SUT PROLENE 4-0 RB1 .5 CRCL 36 (SUTURE) IMPLANT
SUT SILK  1 MH (SUTURE) ×5
SUT SILK 1 MH (SUTURE) ×5 IMPLANT
SUT SILK 2 0SH CR/8 30 (SUTURE) IMPLANT
SUT VIC AB 1 CTX 18 (SUTURE) IMPLANT
SUT VIC AB 1 CTX 36 (SUTURE)
SUT VIC AB 1 CTX36XBRD ANBCTR (SUTURE) IMPLANT
SUT VIC AB 2-0 CTX 36 (SUTURE) IMPLANT
SUT VIC AB 2-0 UR6 27 (SUTURE) ×4 IMPLANT
SUT VIC AB 3-0 SH 18 (SUTURE) ×2 IMPLANT
SUT VIC AB 3-0 X1 27 (SUTURE) ×2 IMPLANT
SUT VICRYL 2 TP 1 (SUTURE) IMPLANT
SYSTEM SAHARA CHEST DRAIN RE-I (WOUND CARE) ×2 IMPLANT
TAPE CLOTH 4X10 WHT NS (GAUZE/BANDAGES/DRESSINGS) ×2 IMPLANT
TAPE CLOTH SURG 4X10 WHT LF (GAUZE/BANDAGES/DRESSINGS) ×4 IMPLANT
TIP APPLICATOR SPRAY EXTEND 16 (VASCULAR PRODUCTS) IMPLANT
TOWEL OR 17X24 6PK STRL BLUE (TOWEL DISPOSABLE) ×4 IMPLANT
TOWEL OR 17X26 10 PK STRL BLUE (TOWEL DISPOSABLE) ×4 IMPLANT
TRAP SPECIMEN MUCOUS 40CC (MISCELLANEOUS) ×2 IMPLANT
TRAY FOLEY CATH 14FRSI W/METER (CATHETERS) ×2 IMPLANT
WATER STERILE IRR 1000ML POUR (IV SOLUTION) ×4 IMPLANT

## 2013-08-06 NOTE — Progress Notes (Signed)
  Echocardiogram 2D Echocardiogram has been performed.  [name] [name] 08/06/2013, 3:12 PM 

## 2013-08-06 NOTE — Op Note (Signed)
CARDIOTHORACIC SURGERY OPERATIVE NOTE  Date of Procedure:   08/06/2013  Preoperative Diagnosis:  Acute Empyema  Postoperative Diagnosis:  same  Procedure:    Left Video-assisted Thoracoscopy for Drainage of Empyema   Surgeon:    Salvatore Decentlarence H. Cornelius Moraswen, MD  Assistant:    Lowella DandyErin Barrett, PA-C and Jari Favreessa Conte, PA-S  Anesthesia:    Laverle HobbyGregory Smith, MD  Operative Findings:   Acute suppurative empyema       BRIEF CLINICAL NOTE AND INDICATIONS FOR SURGERY  Patient is a 23 year old male who presented several days previously with sepsis in association with soft tissue infection overlying the left anterior chest wall. The patient underwent incision and drainage of an abscess involving the pectoralis major muscle the left anterior chest wall by Dr. Candis ShineBeen tried on 08/04/2013. Over the subsequent 24 hours the patient developed left-sided pleuritic chest pain associated with worsening leukocytosis. Chest x-ray and CT scan demonstrate loculated pleural effusion. Bedside thoracentesis yielded incomplete evacuation of the fluid and pleural fluid Gram stain is notably positive for the presence of gram-positive cocci in pairs. The patient and his family are counseled at length regarding the indications, risks, and potential benefits of video-assisted thoracoscopy for definitive drainage of the pleural space. Alternative treatment strategies been discussed. They understand and accept all potential associated risks of surgery and desire to proceed as described.     DETAILS OF THE OPERATIVE PROCEDURE  The patient is brought to the operating room on the above mentioned date and placed in the supine position on the operating table. Central venous line and radial arterial line are placed by the anesthesia team. The patient is already receiving intravenous antibiotics.  Pneumatic sequential compression boots are placed on both lower extremities. General endotracheal anesthesia is induced uneventfully.  The patient was  intubated using a dual lumen endotracheal tube. The patient is turned to the right lateral decubitus position.  The existing dressing on the anterior chest wound is removed.  An axillary roll is placed and the patient's left chest was prepared and draped in a sterile manner.  A small incision is made overlying the seventh intercostal space and the anterior axillary line. The incision is completed into the pleural space with electrocautery. The pleural space is palpated and a blunt sucker tip is placed into the pleural space to evacuate pleural fluid. A total of 1200 mL of fluid are evacuated and portions of the pleural fluid or trapped to be sent for culture.  A 10 mm port was passed through the incision and a thoracoscopic camera advanced through the port. The pleural space is examined visually.  Acute suppurative empyema is noted.  A second incision is made posteriorly overlying the 6th intercostal space.  Through this incision instruments were utilized to bluntly break up all loculations surrounding the lung.  This was technically straightforward.  The pleural space is irrigated with copious warm saline solution. The pleural space is drained using 3 chest tubes exited through separate stab incisions including the original port incision. The remaining incisions are closed in multiple layers in routine fashion. The 3 chest tubes were fixed to close suction drainage device.  The patient tolerated the procedure well, was extubated in the operative room, and transported to the postanesthesia care in stable condition. All sponge instrument and needle counts are verified correct. There were no intraoperative complications. Estimated blood loss was < 100 mL.      Salvatore Decentlarence H. Cornelius Moraswen MD 08/06/2013 12:05 PM

## 2013-08-06 NOTE — Progress Notes (Signed)
2 Days Post-Op  Subjective: Mild SOB, Very hungry.  Objective: Vital signs in last 24 hours: Temp:  [97.5 F (36.4 C)-98.7 F (37.1 C)] 98.7 F (37.1 C) (01/18 0420) Pulse Rate:  [55-110] 55 (01/18 0800) Resp:  [23-48] 37 (01/18 0800) BP: (90-122)/(50-89) 90/50 mmHg (01/18 0800) SpO2:  [90 %-100 %] 94 % (01/18 0800) Weight:  [179 lb 14.3 oz (81.6 kg)] 179 lb 14.3 oz (81.6 kg) (01/18 0500) Last BM Date: 08/02/13  Intake/Output from previous day: 01/17 0701 - 01/18 0700 In: 1958.5 [I.V.:708.5; IV Piggyback:1250] Out: 3605 [Urine:3335; Drains:270] Intake/Output this shift: Total I/O In: 20 [I.V.:20] Out: -   General appearance: cooperative Chest wall: wound L chest MM viable, chest wall cellulitis looks a bit better Cardio: regular rate and rhythm GI: L abdominal wall cellulitis and scrotal edema persist but appear a bit better C/W yesterday's exam/photos  Lab Results:   Recent Labs  08/05/13 0447 08/06/13 0449  WBC 29.6* 21.2*  HGB 11.2* 10.2*  HCT 32.4* 29.1*  PLT 142* 128*   BMET  Recent Labs  08/04/13 0415 08/06/13 0449  NA 132* 134*  K 3.9 3.0*  CL 100 96  CO2 19 27  GLUCOSE 131* 128*  BUN 29* 24*  CREATININE 1.17 0.90  CALCIUM 6.5* 7.2*   PT/INR  Recent Labs  08/03/13 1840  LABPROT 16.3*  INR 1.34   ABG No results found for this basename: PHART, PCO2, PO2, HCO3,  in the last 72 hours  Studies/Results: Ct Abdomen Pelvis Wo Contrast  08/05/2013   CLINICAL DATA:  Diffuse abdominal pain.  EXAM: CT ABDOMEN AND PELVIS WITHOUT CONTRAST  TECHNIQUE: Multidetector CT imaging of the abdomen and pelvis was performed following the standard protocol without intravenous contrast.  COMPARISON:  CT of the chest, abdomen and pelvis performed 08/03/2013  FINDINGS: Small to moderate bilateral pleural effusions are seen, as noted on the recent prior CT of the chest, with bibasilar airspace opacification. Pneumonia cannot be excluded.  The liver and spleen are  unremarkable in appearance. The gallbladder is within normal limits. The pancreas and adrenal glands are unremarkable.  The kidneys are unremarkable in appearance. There is no evidence of hydronephrosis. Contrast is seen within the renal calyces, limiting evaluation for renal stones. No perinephric stranding is appreciated.  A small amount of free fluid is seen within the pelvis, nonspecific in appearance. The small bowel is unremarkable in appearance. The stomach is within normal limits. No acute vascular abnormalities are seen.  The appendix is not definitely characterized; there is no evidence for appendicitis. The colon is largely filled with residual contrast, and is unremarkable in appearance.  The bladder is mildly distended and partially filled with contrast, and is unremarkable in appearance. The prostate remains normal in size. No inguinal lymphadenopathy is seen.  Diffuse soft tissue edema is noted throughout the chest and abdominal wall, worse on the left side of the chest wall. Marked scrotal edema is also seen. Findings raise concern for anasarca.  No acute osseous abnormalities are identified. 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  IMPRESSION: 1. No definite focal abnormality seen within the abdomen or pelvis to explain the patient's symptoms. 2. Small amount of free fluid within the pelvis is nonspecific and may reflect underlying diffuse soft tissue edema. 3. Small to moderate bilateral pleural effusions, with bibasilar airspace opacification. Pneumonia cannot be excluded. 4. Diffuse soft tissue edema throughout the chest and abdominal wall, worse on the left side  of the chest wall. Associated marked scrotal edema seen. Findings raise concern for anasarca. 5. 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   Electronically Signed   By: Roanna RaiderJeffery  Chang M.D.   On: 08/05/2013 23:28   Ct Chest W  Contrast  08/05/2013   CLINICAL DATA:  Evaluate for infiltrates or empyema.  EXAM: CT CHEST WITH CONTRAST  TECHNIQUE: Multidetector CT imaging of the chest was performed during intravenous contrast administration.  CONTRAST:  75mL OMNIPAQUE IOHEXOL 300 MG/ML  SOLN  COMPARISON:  CT scan August 03, 2013.  FINDINGS: On the prior study, there was an abscess reported in the deep left chest wall. This appears to have been evacuated surgically. Is now replaced by a cavitary region filled with air and debris or possibly packing material. This communicates with the skin surface and there is 8 wound VAC over the area.  New from the prior study is a development of moderate bilateral pleural effusions with extensive underlying atelectasis. On the left, in the upper lobe medially, there appears to be a partially loculated component of the pleural effusion with thin enhancing rim. This measures 53 x 17 mm. The pleural effusions otherwise do not show evidence of loculation. In the there is no significant hilar or mediastinal adenopathy.  There are patchy peripheral nodular infiltrates in the aerated portion of the right upper and middle lobe. There are similar findings on the left.  There is diffuse subcutaneous soft tissue increased attenuation consistent with anasarca.  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  IMPRESSION: 1. Postoperative change involving left chest wall abscess. 2. Interval development of fluid overload with anasarca and significant bilateral pleural effusion. There is apparent loculation of a component of the left pleural effusion involving the pleural surface medially at the left lung apex. Sterility uncertain. 3. Extensive passive atelectasis. Additionally the aerated portions of the lungs show patchy nodular peripheral infiltrates. These could represent areas of developing pulmonary edema as well as septic emboli. Radiographic followup recommended    Electronically Signed   By: Esperanza Heiraymond  Rubner M.D.   On: 08/05/2013 11:58   Dg Chest Port 1 View  08/06/2013   CLINICAL DATA:  Pneumonia, ARDS  EXAM: PORTABLE CHEST - 1 VIEW  COMPARISON:  08/05/2013  FINDINGS: No change in enlarged cardiac silhouette or position of right central line. No change in bilateral hazy opacity consistent with underlying pleural effusions and associated consolidation in the mid to lower lung zones bilaterally.  IMPRESSION: Stable bilateral opacities reflecting known pleural effusions and consolidations   Electronically Signed   By: Esperanza Heiraymond  Rubner M.D.   On: 08/06/2013 08:18   Dg Chest Port 1 View  08/05/2013   CLINICAL DATA:  Status post thoracentesis on the left  EXAM: PORTABLE CHEST - 1 VIEW  COMPARISON:  CT scan of the chest and portable chest x-ray of today's date.  FINDINGS: The lungs are reasonably well inflated. The interstitial markings remain increased bilaterally. There is slightly less density on the left consistent with aspiration of pleural fluid but some but a moderate amount of fluid likely remains bilaterally. The cardiopericardial silhouette is enlarged. The central pulmonary vascularity is prominent. There is alveolar infiltrate in the right infrahilar region. The right internal jugular venous catheter tip lies in the region of the junction of the proximal and mid SVC and is stable. There is gas within bowel in the left upper quadrant of the abdomen.  IMPRESSION: 1. There is  no evidence of a post procedure pneumothorax on the left. A small to moderate amount of pleural fluid remains bilaterally. 2. The cardiopericardial silhouette and pulmonary vascularity remain prominent consistent with CHF. 3. Alveolar density in the right infrahilar region is worrisome for pneumonia.   Electronically Signed   By: David  [place]   On: 08/05/2013 14:06   Dg Chest Port 1 View  08/05/2013   CLINICAL DATA:  Suspected left pleural effusion  EXAM: PORTABLE CHEST - 1 VIEW  COMPARISON:   Portable chest x-ray of August 04, 2013  FINDINGS: On the left the pulmonary interstitium has increased in conspicuity and further obscuration of the left hemidiaphragm has occurred. On the right there is new increased density in the perihilar and infrahilar region consistent with atelectasis. A small amount of pleural fluid has accumulated on the right in addition to that known to be present on the left. The cardiopericardial silhouette is top-normal in size. The pulmonary vascularity is indistinct. There is a right internal jugular venous catheter in place whose tip lies in the region of the midportion of the SVC.  IMPRESSION: Increasing interstitial density within both lungs is consistent with atelectasis or pneumonia. Obscuration of the left hemidiaphragm is likely in part due to a small to moderate-sized pleural effusion. There is a new small right pleural effusion as well.   Electronically Signed   By: David  [place]   On: 08/05/2013 09:15    Anti-infectives: Anti-infectives   Start     Dose/Rate Route Frequency Ordered Stop   08/05/13 1300  imipenem-cilastatin (PRIMAXIN) 500 mg in sodium chloride 0.9 % 100 mL IVPB     500 mg 200 mL/hr over 30 Minutes Intravenous 4 times per day 08/05/13 1259     08/04/13 1012  vancomycin (VANCOCIN) 1,000 mg in sodium chloride 0.9 % 1,000 mL irrigation  Status:  Discontinued       As needed 08/04/13 1012 08/04/13 1035   08/04/13 0300  vancomycin (VANCOCIN) IVPB 1000 mg/200 mL premix     1,000 mg 200 mL/hr over 60 Minutes Intravenous Every 8 hours 08/03/13 1931     08/03/13 1830  piperacillin-tazobactam (ZOSYN) IVPB 3.375 g  Status:  Discontinued     3.375 g 12.5 mL/hr over 240 Minutes Intravenous Every 8 hours 08/03/13 1654 08/05/13 1255   08/03/13 1800  vancomycin (VANCOCIN) IVPB 1000 mg/200 mL premix     1,000 mg 200 mL/hr over 60 Minutes Intravenous  Once 08/03/13 1654 08/03/13 1949   08/03/13 1800  clindamycin (CLEOCIN) IVPB 600 mg  Status:   Discontinued     600 mg 100 mL/hr over 30 Minutes Intravenous 4 times per day 08/03/13 1709 08/03/13 1716   08/03/13 1800  clindamycin (CLEOCIN) IVPB 600 mg     600 mg 100 mL/hr over 30 Minutes Intravenous 4 times per day 08/03/13 1717        Assessment/Plan: L pectoral abscess with chest and abdominal wall cellulitis down into upper thighs and scrotum - S/P I&D by TCTS, no further ST debridement needed, CT reviewed Infected L pleural effusion - likely will need VATS today per Dr. Cornelius Moras ID - continue Vanc/Primaxin I aslo spoke with his mother and with Dr. Cornelius Moras at the bedside  LOS: 3 days    Leam Madero E 08/06/2013

## 2013-08-06 NOTE — Anesthesia Procedure Notes (Addendum)
Performed by: Laverle HobbySMITH, GREGORY Endobronchial tube: Left, EBT position confirmed by fiberoptic bronchoscope and Double lumen EBT and 37 Fr   Procedure Name: Intubation Date/Time: 08/06/2013 10:39 AM Performed by: Alanda AmassFRIEDMAN, Cloud Graham A Pre-anesthesia Checklist: Patient identified, Timeout performed, Emergency Drugs available, Suction available and Patient being monitored Patient Re-evaluated:Patient Re-evaluated prior to inductionOxygen Delivery Method: Circle system utilized Preoxygenation: Pre-oxygenation with 100% oxygen Intubation Type: IV induction Ventilation: Mask ventilation without difficulty Laryngoscope Size: Mac and 3 Grade View: Grade I Tube type: Oral Endobronchial tube: Left, Double lumen EBT, EBT position confirmed by auscultation and EBT position confirmed by fiberoptic bronchoscope and 37 Fr Number of attempts: 1 Airway Equipment and Method: Stylet Placement Confirmation: ETT inserted through vocal cords under direct vision,  breath sounds checked- equal and bilateral and positive ETCO2 Secured at: 29 cm Tube secured with: Tape Dental Injury: Teeth and Oropharynx as per pre-operative assessment

## 2013-08-06 NOTE — Progress Notes (Signed)
[address]       [place] [address]             [phone]        CARDIOTHORACIC SURGERY PROGRESS NOTE   R2 Days Post-Op Procedure(s) (LRB): CHEST WALL ABCESS DEBRIDEMENT (N/A) 2 Ply APPLICATION OF WOUND VAC (Left)  Subjective: Feels better.  Still has some left sided pleuritic pain  Objective: Vital signs: BP Readings from Last 1 Encounters:  08/06/13 90/50   Pulse Readings from Last 1 Encounters:  08/06/13 55   Resp Readings from Last 1 Encounters:  08/06/13 37   Temp Readings from Last 1 Encounters:  08/06/13 98.7 F (37.1 C) Oral    Hemodynamics: CVP:  [8 mmHg-11 mmHg] 8 mmHg  Physical Exam:  Rhythm:   sinus  Breath sounds: Diminished at bases L>R  Heart sounds:  RRR  Incisions:  Wound clean w/ no signs of necrotic tissue, cellulitis improved overnight  Abdomen:  Soft, non-distended, non-tender  Extremities:  Warm, well-perfused    Intake/Output from previous day: 01/17 0701 - 01/18 0700 In: 1958.5 [I.V.:708.5; IV Piggyback:1250] Out: 3605 [Urine:3335; Drains:270] Intake/Output this shift: Total I/O In: 20 [I.V.:20] Out: -   Lab Results:  CBC: Recent Labs  08/05/13 0447 08/06/13 0449  WBC 29.6* 21.2*  HGB 11.2* 10.2*  HCT 32.4* 29.1*  PLT 142* 128*    BMET:  Recent Labs  08/04/13 0415 08/06/13 0449  NA 132* 134*  K 3.9 3.0*  CL 100 96  CO2 19 27  GLUCOSE 131* 128*  BUN 29* 24*  CREATININE 1.17 0.90  CALCIUM 6.5* 7.2*     CBG (last 3)   Recent Labs  08/03/13 1646 08/03/13 1903  GLUCAP 91 148*    ABG    Component Value Date/Time   O2SAT 69.8 08/05/2013 1045    CXR: CLINICAL DATA: Pneumonia, ARDS  EXAM:  PORTABLE CHEST - 1 VIEW  COMPARISON: 08/05/2013  FINDINGS:  No change in enlarged cardiac silhouette or position of right  central line. No change in bilateral hazy opacity consistent with  underlying pleural effusions and associated consolidation in the mid  to lower lung zones  bilaterally.  IMPRESSION:  Stable bilateral opacities reflecting known pleural effusions and  consolidations  Electronically Signed  By: Esperanza Heir M.D.  On: 08/06/2013 08:18   CT: CLINICAL DATA: Evaluate for infiltrates or empyema.  EXAM:  CT CHEST WITH CONTRAST  TECHNIQUE:  Multidetector CT imaging of the chest was performed during  intravenous contrast administration.  CONTRAST: 75mL OMNIPAQUE IOHEXOL 300 MG/ML SOLN  COMPARISON: CT scan August 03, 2013.  FINDINGS:  On the prior study, there was an abscess reported in the deep left  chest wall. This appears to have been evacuated surgically. Is now  replaced by a cavitary region filled with air and debris or possibly  packing material. This communicates with the skin surface and there  is 8 wound VAC over the area.  New from the prior study is a development of moderate bilateral  pleural effusions with extensive underlying atelectasis. On the  left, in the upper lobe medially, there appears to be a partially  loculated component of the pleural effusion with thin enhancing rim.  This measures 53 x 17 mm. The pleural effusions otherwise do not  show evidence of loculation. In the there is no significant hilar or  mediastinal adenopathy.  There are patchy peripheral nodular infiltrates in the aerated  portion of the right  upper and middle lobe. There are similar  findings on the left.  There is diffuse subcutaneous soft tissue increased attenuation  consistent with anasarca.  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  IMPRESSION:  1. Postoperative change involving left chest wall abscess.  2. Interval development of fluid overload with anasarca and  significant bilateral pleural effusion. There is apparent loculation  of a component of the left pleural effusion involving the pleural  surface medially at the left lung apex. Sterility uncertain.  3. Extensive passive  atelectasis. Additionally the aerated portions  of the lungs show patchy nodular peripheral infiltrates. These could  represent areas of developing pulmonary edema as well as septic  emboli. Radiographic followup recommended  Electronically Signed  By: Esperanza Heiraymond Rubner M.D.  On: 08/05/2013 11:58    MICRO: Component Specimen Description PLEURAL FLUID Special Requests NONE Gram Stain WBC PRESENT, PREDOMINANTLY PMN GRAM POSITIVE COCCI IN PAIRS Gram Stain Report Called to,Read Back By and Verified With: Gram Stain Report Called to,Read Back By and Verified With: Shirlee LimerickDUNLAP J RN 08/05/13 1754 WOOTEN K [facility] CYTOSPIN Performed at [facility] Culture NO GROWTH 1 DAY Performed at [facility] Report Status PENDING  Component Specimen Description TISSUE CHEST LEFT Special Requests TISSUE FROM LEFT CHEST WALL ABSCESS Gram Stain Performed at [facility] RARE WBC PRESENT,BOTH PMN AND MONONUCLEAR RARE GRAM POSITIVE COCCI IN PAIRS IN CHAINS Gram Stain Report Called to,Read Back By and Verified With: Gram Stain Report Called to,Read Back By and Verified With: DR [name] ON 08/04/13 BY K SCHULTZ CORRECTED RESULTS CALLED TO: JAMES D ON 08/05/13 AT 0830 BY SMIAS Performed at [facility] Culture Culture reincubated for better growth Performed at [facility] Report Status PENDING   Assessment/Plan: S/P Procedure(s) (LRB): CHEST WALL ABCESS DEBRIDEMENT (N/A) 2 Ply APPLICATION OF WOUND VAC (Left)  Patient has improved clinically, but CXR confirms incomplete evacuation of what is likely at least partially loculated left pleural effusion.  Pleural fluid noted to have Gram positive cocci in pairs consistent with acute empyema, likely due to direct extension of soft tissue infection into the pleural cavity.  Under the circumstances I feel the best option is to proceed to OR for VATS to more completely drain the pleural space.  I have reviewed the  indications, risks and potential benefits of surgery with the patient and his family.  Alternative treatment strategies reviewed.  All questions answered.  For OR as soon as practical today.  Kaedance Magos H 08/06/2013 8:57 AM

## 2013-08-06 NOTE — Anesthesia Preprocedure Evaluation (Addendum)
Anesthesia Evaluation  Patient identified by MRN, date of birth, ID band Patient confused    Reviewed: Allergy & Precautions, H&P , NPO status , Patient's Chart, lab work & pertinent test results  Airway Mallampati: II TM Distance: >3 FB Neck ROM: Full    Dental  (+) Teeth Intact and Dental Advisory Given   Pulmonary shortness of breath,          Cardiovascular     Neuro/Psych    GI/Hepatic   Endo/Other    Renal/GU      Musculoskeletal   Abdominal   Peds  Hematology   Anesthesia Other Findings ARDS Septic shock Empyema  Reproductive/Obstetrics                          Anesthesia Physical Anesthesia Plan  ASA: III  Anesthesia Plan: General   Post-op Pain Management:    Induction: Intravenous  Airway Management Planned:   Additional Equipment: Arterial line  Intra-op Plan:   Post-operative Plan: Extubation in OR and Possible Post-op intubation/ventilation  Informed Consent:   Plan Discussed with:   Anesthesia Plan Comments:         Anesthesia Quick Evaluation

## 2013-08-06 NOTE — Anesthesia Postprocedure Evaluation (Addendum)
  Anesthesia Post-op Note  Patient: [name] [name]  Procedure(s) Performed: Procedure(s): VIDEO ASSISTED THORACOSCOPY (VATS)/EMPYEMA (Left)  Patient Location: PACU  Anesthesia Type:General  Level of Consciousness: awake, alert , oriented and patient cooperative  Airway and Oxygen Therapy: Patient Spontanous Breathing  Post-op Pain: moderate  Post-op Assessment: Post-op Vital signs reviewed, Patient's Cardiovascular Status Stable, Respiratory Function Stable, Patent Airway, No signs of Nausea or vomiting and Pain level controlled  Post-op Vital Signs: stable  Complications: No apparent anesthesia complications

## 2013-08-06 NOTE — Progress Notes (Signed)
TCTS BRIEF PROGRESS NOTE  Day of Surgery  S/P Procedure(s) (LRB): VIDEO ASSISTED THORACOSCOPY (VATS)/EMPYEMA (Left)   Awake and alert.  Mild soreness but o/w feels okay Breathing comfortably on O2 3 L/min O2 sats 93% Excellent UOP Low volume chest tube output CXR looks good  Plan: Continue current plan  OWEN,CLARENCE H 08/06/2013 3:27 PM

## 2013-08-06 NOTE — Transfer of Care (Signed)
Immediate Anesthesia Transfer of Care Note  Patient: [name] [name]  Procedure(s) Performed: Procedure(s): VIDEO ASSISTED THORACOSCOPY (VATS)/EMPYEMA (Left)  Patient Location: PACU  Anesthesia Type:General  Level of Consciousness: awake  Airway & Oxygen Therapy: Patient Spontanous Breathing and Patient connected to nasal cannula oxygen  Post-op Assessment: Report given to PACU RN, Post -op Vital signs reviewed and stable and tachypneic  Post vital signs: Reviewed and stable  Complications: No apparent anesthesia complications

## 2013-08-06 NOTE — Consult Note (Signed)
WOC wound consult note Reason for Consult: Patient had a NPWT device placed on Friday and it was discontinued on Saturday.  Patient returned to OR on Sunday and an NPWT device was not placed. Patient not assessed, consult discontinued. WOC nursing team will not follow, but will remain available to this patient, the nursing, surgical and medical teams.  Please re-consult if needed. Thanks, Ladona MowLaurie Kanton Kamel, MSN, RN, GNP, [employer]ier[employer]OCN, CWON-AP [phone])

## 2013-08-06 NOTE — Progress Notes (Signed)
Name: [name] [name] MRN: [id number] DOB: 12-Feb-1991    ADMISSION DATE:  08/03/2013  REFERRING MD :  EDP  PRIMARY SERVICE: PCCM  CHIEF COMPLAINT:  Abscess   BRIEF PATIENT DESCRIPTION: 23 yo male without medical history admitted 1/15 with large L chest wall abscess with gaseous formation concerning for necrotizing fascitis.   SIGNIFICANT EVENTS / STUDIES:  1/15 CT abd/pelvis >>> 6.6x2.7cm L anterior chest wall abscess with marked edema throughout, gas formation and ?nec fasc  1/17 Thoracentesis >>> 1000 mL clear yellow fluid LEFT drained  LINES / TUBES:  R IJ CVL 1/15 >>> Wound vac Lupper chest  CULTURES: 1/15  Blood >>> 1/16 wound cult >>> prelim GPC chains and pairs  ANTIBIOTICS: Vanc 1/15 >>> Zosyn 1/15 >>> 1/17 Imipenem 1/17 >>> Clindamycin 1/15 >>>  INTERVAL HISTORY:  Resting comfortably, per family rash is improving mildly and confirmed by nursing. Tolerating PO pain meds and diet. No foley.   VITAL SIGNS: Temp:  [97.5 F (36.4 C)-98.7 F (37.1 C)] 98.7 F (37.1 C) (01/18 0420) Pulse Rate:  [82-110] 85 (01/18 0200) Resp:  [23-44] 37 (01/18 0200) BP: (95-133)/(56-89) 99/63 mmHg (01/18 0200) SpO2:  [90 %-100 %] 97 % (01/18 0200)  HEMODYNAMICS: CVP:  [8 mmHg-13 mmHg] 8 mmHg  VENTILATOR SETTINGS:   INTAKE / OUTPUT: Intake/Output     01/17 0701 - 01/18 0700   I.V. (mL/kg) 568.5 (7)   IV Piggyback 700   Total Intake(mL/kg) 1268.5 (15.5)   Urine (mL/kg/hr) 2935 (1.5)   Drains 270 (0.1)   Total Output 3205   Net -1936.5         PHYSICAL EXAMINATION: General:  Resting comfortably Neuro:  Sleepy, alert, appropriate HEENT:  Mm moist, no JVD  Cardiovascular:  s1s2 rrr Lungs: mildly labored Abdomen:  Soft, +bs  Musculoskeletal:  L chest wall/shoulder with wound dressing in place, still with rash going down chest wall to abdomen.  LABS:  CBC  Recent Labs Lab 08/04/13 0415 08/05/13 0447 08/06/13 0449  WBC 18.7* 29.6* 21.2*  HGB 12.9* 11.2* 10.2*   HCT 37.0* 32.4* 29.1*  PLT 140* 142* 128*   Coag's  Recent Labs Lab 08/03/13 1840  INR 1.34   BMET  Recent Labs Lab 08/03/13 1840 08/04/13 0415 08/06/13 0449  NA 123* 132* 134*  K 4.1 3.9 3.0*  CL 85* 100 96  CO2 18* 19 27  BUN 29* 29* 24*  CREATININE 0.98 1.17 0.90  GLUCOSE 122* 131* 128*   Electrolytes  Recent Labs Lab 08/03/13 1840 08/04/13 0415 08/06/13 0449  CALCIUM 7.8* 6.5* 7.2*  MG 2.1  --   --   PHOS 3.7  --   --    Sepsis Markers  Recent Labs Lab 08/03/13 1840 08/03/13 2200 08/04/13 1300  LATICACIDVEN 3.6* 2.9* 3.3*  PROCALCITON 27.51  --   --    ABG No results found for this basename: PHART, PCO2ART, PO2ART,  in the last 168 hours  Liver Enzymes  Recent Labs Lab 08/03/13 1840 08/06/13 0449  AST 39* 81*  ALT 15 31  ALKPHOS 68 78  BILITOT 2.0* 1.1  ALBUMIN 1.8* 1.3*   Cardiac Enzymes No results found for this basename: TROPONINI, PROBNP,  in the last 168 hours Glucose  Recent Labs Lab 08/03/13 1646 08/03/13 1903  GLUCAP 91 148*   CXR: 1/18>>>stable, still with fluid in both bases L > R  ASSESSMENT / PLAN: Principal Problem:   Sepsis due to Streptococcus, group A Active Problems:   Chest wall  abscess   Pain   Septic shock   ARDS (adult respiratory distress syndrome)   Acute respiratory failure with hypoxia   Protein-calorie malnutrition, severe   Empyema, left   PULMONARY A:  ALI pattern, L empyema Acute hypoxemic Resp failure  P:   For OR for VATS May need vent postop  CARDIOVASCULAR A:  Septic shock. P:  Goal MAP > 65 Trend lactic acid Awaiting Echo  RENAL A:   Mild hyponatremia. Hypokalemia. P:   Goal CVP 8-10 Replete K, Check Mag Trend BMP  GASTROINTESTINAL A:   Nutrition. GI Px. P:   NPO for VATS Protonix  HEMATOLOGIC A:   DVT Px P:  Trend CBC Heparin sq  INFECTIOUS A:   Chest wall abscess. No evident nec fas Empyema L pleural space, pos org on c/s  wound pos Strep Group  A pyogenes , suspect skin source P:   apprec ID consult apprec TCTS Owen taking pt to OR for VATS Abx as above  ENDOCRINE A:   No active. P:   Monitor  NEUROLOGIC A:   Pain. P:   Tramadol and Percocet PO  Genella Mech, PGY-3  CC40 I have seen and examined this pt with Dr Dorise Hiss.  I agree with the above notes with edits. Plan is for the pt to go to OR for VATS for L empyema.  THora pos for GPC >>strep Group A pyogenes.  Overall pt seems some better today, but may need vent postop.  Dorcas Carrow Beeper  [phone]  Cell  [phone]  If no response or cell goes to voicemail, call beeper [phone]

## 2013-08-06 NOTE — Progress Notes (Signed)
eLink Physician-Brief Progress Note Patient Name: Olam IdlerJoshua Dearing DOB: [birth year] MRN: [id number]  Date of Service  08/06/2013   HPI/Events of Note     eICU Interventions  Hypokalemia, repleted    Intervention Category Minor Interventions: Electrolytes abnormality - evaluation and management  MCQUAID, DOUGLAS 08/06/2013, 6:16 AM

## 2013-08-06 NOTE — Brief Op Note (Signed)
08/03/2013 - 08/06/2013  11:56 AM  PATIENT:  [name] [name]  22 y.o. male  PRE-OPERATIVE DIAGNOSIS:  empyema  POST-OPERATIVE DIAGNOSIS:  empyema  PROCEDURE:  Procedure(s): VIDEO ASSISTED THORACOSCOPY (VATS)/EMPYEMA (Left)  SURGEON:  Surgeon(s) and Role:    * Purcell Nailslarence H Jovani Colquhoun, MD - Primary  ASSISTANTS: Lowella DandyErin Barrett, PA-C,  Jari Favreessa Conte, PA-S   ANESTHESIA:   general  EBL:  Total I/O In: 20 [I.V.:20] Out: 200 [Urine:150; Blood:50]  BLOOD ADMINISTERED:none  DRAINS: 3 Chest Tube(s) in the left pleural space   LOCAL MEDICATIONS USED:  NONE  SPECIMEN:  No Specimen  DISPOSITION OF SPECIMEN:  N/A  COUNTS:  YES  TOURNIQUET:  * No tourniquets in log *  DICTATION: .Note written in EPIC  PLAN OF CARE: to PACU then back to MICU  PATIENT DISPOSITION:  PACU - hemodynamically stable.   Delay start of Pharmacological VTE agent (>24hrs) due to surgical blood loss or risk of bleeding: yes  Evalee Gerard H 08/06/2013 11:59 AM

## 2013-08-07 ENCOUNTER — Encounter (HOSPITAL_COMMUNITY)
Admission: AD | Disposition: A | Discharge status: Home Health | Payer: Self-pay | Source: Ambulatory Visit | Attending: Pulmonary Disease

## 2013-08-07 ENCOUNTER — Inpatient Hospital Stay (HOSPITAL_COMMUNITY): Payer: Managed Care, Other (non HMO)

## 2013-08-07 DIAGNOSIS — D72829 Elevated white blood cell count, unspecified: Secondary | ICD-10-CM

## 2013-08-07 LAB — CBC
HCT: 32.9 % — ABNORMAL LOW (ref 39.0–52.0)
HEMOGLOBIN: 11.5 g/dL — AB (ref 13.0–17.0)
MCH: 29.4 pg (ref 26.0–34.0)
MCHC: 35 g/dL (ref 30.0–36.0)
MCV: 84.1 fL (ref 78.0–100.0)
Platelets: 144 10*3/uL — ABNORMAL LOW (ref 150–400)
RBC: 3.91 MIL/uL — AB (ref 4.22–5.81)
RDW: 13.3 % (ref 11.5–15.5)
WBC: 27.2 10*3/uL — AB (ref 4.0–10.5)

## 2013-08-07 LAB — CULTURE, ROUTINE-ABSCESS

## 2013-08-07 LAB — BASIC METABOLIC PANEL
BUN: 22 mg/dL (ref 6–23)
CALCIUM: 6.9 mg/dL — AB (ref 8.4–10.5)
CHLORIDE: 100 meq/L (ref 96–112)
CO2: 25 meq/L (ref 19–32)
Creatinine, Ser: 0.81 mg/dL (ref 0.50–1.35)
GFR calc Af Amer: >90 mL/min (ref >90)
GFR calc non Af Amer: >90 mL/min (ref >90)
Glucose, Bld: 103 mg/dL — ABNORMAL HIGH (ref 70–99)
POTASSIUM: 3.5 meq/L — AB (ref 3.7–5.3)
SODIUM: 135 meq/L — AB (ref 137–147)

## 2013-08-07 LAB — TISSUE CULTURE

## 2013-08-07 LAB — MAGNESIUM: MAGNESIUM: 2.2 mg/dL (ref 1.5–2.5)

## 2013-08-07 LAB — PATHOLOGIST SMEAR REVIEW: Path Review: REACTIVE

## 2013-08-07 SURGERY — APPLICATION, WOUND VAC
Anesthesia: Monitor Anesthesia Care | Site: Chest

## 2013-08-07 MED ORDER — VANCOMYCIN HCL 1000 MG IV SOLR
1000.0000 mg | INTRAVENOUS | Status: AC
  Filled 2013-08-07: qty 1000

## 2013-08-07 NOTE — Progress Notes (Signed)
1 Day Post-Op  Subjective: No complaints currently OR yesterday  Objective: Vital signs in last 24 hours: Temp:  [97.7 F (36.5 C)-99 F (37.2 C)] 98.3 F (36.8 C) (01/19 0404) Pulse Rate:  [83-112] 99 (01/19 0600) Resp:  [20-51] 42 (01/19 0600) BP: (91-118)/(48-81) 102/66 mmHg (01/19 0600) SpO2:  [91 %-99 %] 93 % (01/19 0600) Arterial Line BP: (86-168)/(58-83) 147/73 mmHg (01/19 0600) Weight:  [183 lb 6.8 oz (83.2 kg)] 183 lb 6.8 oz (83.2 kg) (01/19 0500) Last BM Date: 08/02/13  Intake/Output from previous day: 01/18 0701 - 01/19 0700 In: 2180.7 [P.O.:720; I.V.:1010.7; IV Piggyback:450] Out: 2205 [Urine:1505; Blood:50; Chest Tube:650] Intake/Output this shift:    Erythema of chest wall going down to flank improving No fluctuance  Lab Results:   Recent Labs  08/06/13 0449 08/07/13 0400  WBC 21.2* 27.2*  HGB 10.2* 11.5*  HCT 29.1* 32.9*  PLT 128* 144*   BMET  Recent Labs  08/06/13 0449 08/07/13 0400  NA 134* 135*  K 3.0* 3.5*  CL 96 100  CO2 27 25  GLUCOSE 128* 103*  BUN 24* 22  CREATININE 0.90 0.81  CALCIUM 7.2* 6.9*   PT/INR No results found for this basename: LABPROT, INR,  in the last 72 hours ABG No results found for this basename: PHART, PCO2, PO2, HCO3,  in the last 72 hours  Studies/Results: Ct Abdomen Pelvis Wo Contrast  08/05/2013   CLINICAL DATA:  Diffuse abdominal pain.  EXAM: CT ABDOMEN AND PELVIS WITHOUT CONTRAST  TECHNIQUE: Multidetector CT imaging of the abdomen and pelvis was performed following the standard protocol without intravenous contrast.  COMPARISON:  CT of the chest, abdomen and pelvis performed 08/03/2013  FINDINGS: Small to moderate bilateral pleural effusions are seen, as noted on the recent prior CT of the chest, with bibasilar airspace opacification. Pneumonia cannot be excluded.  The liver and spleen are unremarkable in appearance. The gallbladder is within normal limits. The pancreas and adrenal glands are unremarkable.   The kidneys are unremarkable in appearance. There is no evidence of hydronephrosis. Contrast is seen within the renal calyces, limiting evaluation for renal stones. No perinephric stranding is appreciated.  A small amount of free fluid is seen within the pelvis, nonspecific in appearance. The small bowel is unremarkable in appearance. The stomach is within normal limits. No acute vascular abnormalities are seen.  The appendix is not definitely characterized; there is no evidence for appendicitis. The colon is largely filled with residual contrast, and is unremarkable in appearance.  The bladder is mildly distended and partially filled with contrast, and is unremarkable in appearance. The prostate remains normal in size. No inguinal lymphadenopathy is seen.  Diffuse soft tissue edema is noted throughout the chest and abdominal wall, worse on the left side of the chest wall. Marked scrotal edema is also seen. Findings raise concern for anasarca.  No acute osseous abnormalities are identified. 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  IMPRESSION: 1. No definite focal abnormality seen within the abdomen or pelvis to explain the patient's symptoms. 2. Small amount of free fluid within the pelvis is nonspecific and may reflect underlying diffuse soft tissue edema. 3. Small to moderate bilateral pleural effusions, with bibasilar airspace opacification. Pneumonia cannot be excluded. 4. Diffuse soft tissue edema throughout the chest and abdominal wall, worse on the left side of the chest wall. Associated marked scrotal edema seen. Findings raise concern for anasarca. 5. Chronic bilateral pars defects at L5, without  definite evidence of anterolisthesis.   Electronically Signed   By: Roanna Raider M.D.   On: 08/05/2013 23:28   Ct Chest W Contrast  08/05/2013   CLINICAL DATA:  Evaluate for infiltrates or empyema.  EXAM: CT CHEST WITH CONTRAST  TECHNIQUE: Multidetector CT  imaging of the chest was performed during intravenous contrast administration.  CONTRAST:  75mL OMNIPAQUE IOHEXOL 300 MG/ML  SOLN  COMPARISON:  CT scan August 03, 2013.  FINDINGS: On the prior study, there was an abscess reported in the deep left chest wall. This appears to have been evacuated surgically. Is now replaced by a cavitary region filled with air and debris or possibly packing material. This communicates with the skin surface and there is 8 wound VAC over the area.  New from the prior study is a development of moderate bilateral pleural effusions with extensive underlying atelectasis. On the left, in the upper lobe medially, there appears to be a partially loculated component of the pleural effusion with thin enhancing rim. This measures 53 x 17 mm. The pleural effusions otherwise do not show evidence of loculation. In the there is no significant hilar or mediastinal adenopathy.  There are patchy peripheral nodular infiltrates in the aerated portion of the right upper and middle lobe. There are similar findings on the left.  There is diffuse subcutaneous soft tissue increased attenuation consistent with anasarca.  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  IMPRESSION: 1. Postoperative change involving left chest wall abscess. 2. Interval development of fluid overload with anasarca and significant bilateral pleural effusion. There is apparent loculation of a component of the left pleural effusion involving the pleural surface medially at the left lung apex. Sterility uncertain. 3. Extensive passive atelectasis. Additionally the aerated portions of the lungs show patchy nodular peripheral infiltrates. These could represent areas of developing pulmonary edema as well as septic emboli. Radiographic followup recommended   Electronically Signed   By: Esperanza Heir M.D.   On: 08/05/2013 11:58   Dg Chest Portable 1 View  08/06/2013   EXAM: PORTABLE CHEST - 1  VIEW  COMPARISON:  Chest x-ray of earlier today at 6:13 a.m.  FINDINGS: The lungs are reasonably well inflated. There is no pneumothorax on the left. There is likely pleural fluid on the left. Along the lateral thoracic wall and in the apex. There are 3 large caliber chest tubes present. The uppermost tube has its tip projecting over the posterior lateral aspect of the left seventh rib adjacent to the cardiac apex. The second tube has its tip oriented inferiorly likely in the posterior costophrenic gutter. The third tube tip also is oriented inferiorly but also more medially into the costophrenic gutter. The right internal jugular venous catheter tip lies in the region of the proximal SVC.  The pulmonary interstitial markings of both lungs are mildly increased but are somewhat less conspicuous than on the previous study. Confluent density in the right infrahilar region and in the retrocardiac region is present and slightly improved. The cardiopericardial silhouette is not enlarged. The pulmonary vascularity is indistinct.  IMPRESSION: 1. There is positioning of the 3 left-sided chest tubes is as described. There is no evidence of a pneumothorax. A small amount of pleural fluid is suspected. 2. The interstitial markings of both lungs are mildly increased but have improved. Confluent density inferiorly, bilaterally is also less conspicuous consistent with resolving atelectasis.   Electronically Signed   By: David  [place]   On: 08/06/2013 13:33  Dg Chest Port 1 View  08/06/2013   CLINICAL DATA:  Pneumonia, ARDS  EXAM: PORTABLE CHEST - 1 VIEW  COMPARISON:  08/05/2013  FINDINGS: No change in enlarged cardiac silhouette or position of right central line. No change in bilateral hazy opacity consistent with underlying pleural effusions and associated consolidation in the mid to lower lung zones bilaterally.  IMPRESSION: Stable bilateral opacities reflecting known pleural effusions and consolidations   Electronically  Signed   By: Esperanza Heiraymond  Rubner M.D.   On: 08/06/2013 08:18   Dg Chest Port 1 View  08/05/2013   CLINICAL DATA:  Status post thoracentesis on the left  EXAM: PORTABLE CHEST - 1 VIEW  COMPARISON:  CT scan of the chest and portable chest x-ray of today's date.  FINDINGS: The lungs are reasonably well inflated. The interstitial markings remain increased bilaterally. There is slightly less density on the left consistent with aspiration of pleural fluid but some but a moderate amount of fluid likely remains bilaterally. The cardiopericardial silhouette is enlarged. The central pulmonary vascularity is prominent. There is alveolar infiltrate in the right infrahilar region. The right internal jugular venous catheter tip lies in the region of the junction of the proximal and mid SVC and is stable. There is gas within bowel in the left upper quadrant of the abdomen.  IMPRESSION: 1. There is no evidence of a post procedure pneumothorax on the left. A small to moderate amount of pleural fluid remains bilaterally. 2. The cardiopericardial silhouette and pulmonary vascularity remain prominent consistent with CHF. 3. Alveolar density in the right infrahilar region is worrisome for pneumonia.   Electronically Signed   By: David  [place]   On: 08/05/2013 14:06    Anti-infectives: Anti-infectives   Start     Dose/Rate Route Frequency Ordered Stop   08/06/13 1325  cefUROXime (ZINACEF) 1.5 g in dextrose 5 % 50 mL IVPB  Status:  Discontinued     1.5 g 100 mL/hr over 30 Minutes Intravenous 60 min pre-op 08/06/13 1326 08/06/13 1336   08/06/13 1200  Ampicillin-Sulbactam (UNASYN) 3 g in sodium chloride 0.9 % 100 mL IVPB     3 g 100 mL/hr over 60 Minutes Intravenous Every 6 hours 08/06/13 1103     08/05/13 1300  [MAR Hold]  imipenem-cilastatin (PRIMAXIN) 500 mg in sodium chloride 0.9 % 100 mL IVPB  Status:  Discontinued     (On MAR Hold since 08/06/13 0951)   500 mg 200 mL/hr over 30 Minutes Intravenous 4 times per day 08/05/13  1259 08/06/13 1103   08/04/13 1012  vancomycin (VANCOCIN) 1,000 mg in sodium chloride 0.9 % 1,000 mL irrigation  Status:  Discontinued       As needed 08/04/13 1012 08/04/13 1035   08/04/13 0300  [MAR Hold]  vancomycin (VANCOCIN) IVPB 1000 mg/200 mL premix  Status:  Discontinued     (On MAR Hold since 08/06/13 0951)   1,000 mg 200 mL/hr over 60 Minutes Intravenous Every 8 hours 08/03/13 1931 08/06/13 1103   08/03/13 1830  piperacillin-tazobactam (ZOSYN) IVPB 3.375 g  Status:  Discontinued     3.375 g 12.5 mL/hr over 240 Minutes Intravenous Every 8 hours 08/03/13 1654 08/05/13 1255   08/03/13 1800  vancomycin (VANCOCIN) IVPB 1000 mg/200 mL premix     1,000 mg 200 mL/hr over 60 Minutes Intravenous  Once 08/03/13 1654 08/03/13 1949   08/03/13 1800  clindamycin (CLEOCIN) IVPB 600 mg  Status:  Discontinued     600 mg 100 mL/hr over  30 Minutes Intravenous 4 times per day 08/03/13 1709 08/03/13 1716   08/03/13 1800  clindamycin (CLEOCIN) IVPB 600 mg     600 mg 100 mL/hr over 30 Minutes Intravenous 4 times per day 08/03/13 1717        Assessment/Plan: s/p Procedure(s): VIDEO ASSISTED THORACOSCOPY (VATS)/EMPYEMA (Left)  Chest wall and left flank cellulitis  Slowly improving.  Will continue to follow with you  LOS: 4 days    Ammie Warrick A 08/07/2013

## 2013-08-07 NOTE — Progress Notes (Signed)
[facility]    Date of Admission:  08/03/2013   Total days of antibiotics 5        Day 5 clinda        Day 2 amp/sub           ID: [name] [name] is a 23 y.o. 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 ampicillin-sulbactam (UNASYN) IV  3 g Intravenous Q6H  . clindamycin (CLEOCIN) IV  600 mg Intravenous Q6H  . feeding supplement (ENSURE COMPLETE)  237 mL Oral TID BM  . influenza vac split quadrivalent PF  0.5 mL Intramuscular Tomorrow-1000  .  morphine injection  4 mg Intravenous Once  . pantoprazole (PROTONIX) IV  40 mg Intravenous Q24H    Objective: Vital signs in last 24 hours: Temp:  [97.7 F (36.5 C)-99.4 F (37.4 C)] 99.4 F (37.4 C) (01/19 0813) Pulse Rate:  [83-112] 100 (01/19 0900) Resp:  [20-51] 38 (01/19 0900) BP: (91-118)/(48-81) 104/72 mmHg (01/19 0900) SpO2:  [91 %-99 %] 95 % (01/19 0900) Arterial Line BP: (86-168)/(58-83) 136/77 mmHg (01/19 0900) Weight:  [183 lb 6.8 oz (83.2 kg)] 183 lb 6.8 oz (83.2 kg) (01/19 0500) Physical Exam  Constitutional: oriented to person, place, and time. He appears well-developed and well-nourished. No distress.  HENT:  Mouth/Throat: Oropharynx is clear and moist. No oropharyngeal exudate.  Cardiovascular: tachycardic and normal heart sounds. Exam reveals no gallop and no friction rub.  No murmur heard.  Pulmonary/Chest: Effort normal and breath sounds normal. No respiratory distress. He has no wheezes. Left chest tube and ant chest wall bandaged. Chest tube output  serosanginous appearing Abdominal: Soft. Bowel sounds are normal. He exhibits no distension. There is no tenderness.  Lymphadenopathy:  no cervical adenopathy.  Neurological: alert and oriented to person, place, and time.  Skin: Skin is warm and dry. No rash noted. No erythema.  Psychiatric:  a normal mood and affect.  behavior is normal.    Lab Results  Recent Labs  08/06/13 0449 08/07/13 0400  WBC 21.2* 27.2*  HGB 10.2* 11.5*  HCT 29.1* 32.9*  NA 134* 135*  K 3.0* 3.5*  CL 96 100  CO2 27 25  BUN 24* 22  CREATININE 0.90 0.81   Liver Panel  Recent Labs  08/05/13 1436 08/06/13 0449  PROT 5.0* 4.8*  ALBUMIN  --  1.3*  AST  --  81*  ALT  --  31  ALKPHOS  --  78  BILITOT  --  1.1    Microbiology: 1/18 left pleural fluid NGTD 1/17 pleural fluid = gpc in prs. No growth 1/16 abscess = group a strep 1/15 blood cx = NGTD  Studies/Results: Ct Abdomen Pelvis Wo Contrast  08/05/2013   CLINICAL DATA:  Diffuse abdominal pain.  EXAM: CT ABDOMEN AND PELVIS WITHOUT CONTRAST  TECHNIQUE: Multidetector CT imaging of the abdomen and pelvis was performed following the standard protocol without intravenous contrast.  COMPARISON:  CT of the chest, abdomen and pelvis performed 08/03/2013  FINDINGS: Small to moderate bilateral pleural effusions are seen,  as noted on the recent prior CT of the chest, with bibasilar airspace opacification. Pneumonia cannot be excluded.  The liver and spleen are unremarkable in appearance. The gallbladder is within normal limits. The pancreas and adrenal glands are unremarkable.  The kidneys are unremarkable in appearance. There is no evidence of hydronephrosis. Contrast is seen within the renal calyces, limiting evaluation for renal stones. No perinephric stranding is appreciated.  A small amount of free fluid is seen within the pelvis, nonspecific in appearance. The small bowel is unremarkable in appearance. The stomach is within normal limits. No acute vascular  abnormalities are seen.  The appendix is not definitely characterized; there is no evidence for appendicitis. The colon is largely filled with residual contrast, and is unremarkable in appearance.  The bladder is mildly distended and partially filled with contrast, and is unremarkable in appearance. The prostate remains normal in size. No inguinal lymphadenopathy is seen.  Diffuse soft tissue edema is noted throughout the chest and abdominal wall, worse on the left side of the chest wall. Marked scrotal edema is also seen. Findings raise concern for anasarca.  No acute osseous abnormalities are identified. 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  IMPRESSION: 1. No definite focal abnormality seen within the abdomen or pelvis to explain the patient's symptoms. 2. Small amount of free fluid within the pelvis is nonspecific and may reflect underlying diffuse soft tissue edema. 3. Small to moderate bilateral pleural effusions, with bibasilar airspace opacification. Pneumonia cannot be excluded. 4. Diffuse soft tissue edema throughout the chest and abdominal wall, worse on the left side of the chest wall. Associated marked scrotal edema seen. Findings raise concern for anasarca. 5. 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   Electronically Signed   By: Roanna Raider M.D.   On: 08/05/2013 23:28   Ct Chest W Contrast  08/05/2013   CLINICAL DATA:  Evaluate for infiltrates or empyema.  EXAM: CT CHEST WITH CONTRAST  TECHNIQUE: Multidetector CT imaging of the chest was performed during intravenous contrast administration.  CONTRAST:  75mL OMNIPAQUE IOHEXOL 300 MG/ML  SOLN  COMPARISON:  CT scan August 03, 2013.  FINDINGS: On the prior study, there was an abscess reported in the deep left chest wall. This appears to have been evacuated surgically. Is now replaced by a cavitary region filled with air and debris or possibly packing  material. This communicates with the skin surface and there is 8 wound VAC over the area.  New from the prior study is a development of moderate bilateral pleural effusions with extensive underlying atelectasis. On the left, in the upper lobe medially, there appears to be a partially loculated component of the pleural effusion with thin enhancing rim. This measures 53 x 17 mm. The pleural effusions otherwise do not show evidence of loculation. In the there is no significant hilar or mediastinal adenopathy.  There are patchy peripheral nodular infiltrates in the aerated portion of the right upper and middle lobe. There are similar findings on the left.  There is diffuse subcutaneous soft tissue increased attenuation consistent with anasarca.  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  IMPRESSION: 1. Postoperative change involving left chest wall abscess. 2. Interval development of fluid overload with anasarca and significant bilateral pleural effusion. There is apparent loculation of a component of the left pleural effusion involving the pleural surface medially at the left lung apex. Sterility uncertain. 3. Extensive  passive atelectasis. Additionally the aerated portions of the lungs show patchy nodular peripheral infiltrates. These could represent areas of developing pulmonary edema as well as septic emboli. Radiographic followup recommended   Electronically Signed   By: Esperanza Heiraymond  Rubner M.D.   On: 08/05/2013 11:58   Dg Chest Port 1 View  08/07/2013   CLINICAL DATA:  Empyema  EXAM: PORTABLE CHEST - 1 VIEW  COMPARISON:  August 06, 2013  FINDINGS: Chest tubes on the left are unchanged in position. Central catheter tip is in the superior vena cava. No apparent pneumothorax.  There is increase in consolidation throughout most of the left lung. There is patchy atelectasis in the right base with a small loculated effusion in the minor fissure region on the right. The  heart is mildly enlarged with normal pulmonary vascularity. No adenopathy.  IMPRESSION: Increase consolidation throughout much of the left lung. Stable cardiomegaly. Small loculated effusion in the right minor fissure. No pneumothorax apparent.   Electronically Signed   By: Bretta BangWilliam  Woodruff M.D.   On: 08/07/2013 08:13   Dg Chest Portable 1 View  08/06/2013   EXAM: PORTABLE CHEST - 1 VIEW  COMPARISON:  Chest x-ray of earlier today at 6:13 a.m.  FINDINGS: The lungs are reasonably well inflated. There is no pneumothorax on the left. There is likely pleural fluid on the left. Along the lateral thoracic wall and in the apex. There are 3 large caliber chest tubes present. The uppermost tube has its tip projecting over the posterior lateral aspect of the left seventh rib adjacent to the cardiac apex. The second tube has its tip oriented inferiorly likely in the posterior costophrenic gutter. The third tube tip also is oriented inferiorly but also more medially into the costophrenic gutter. The right internal jugular venous catheter tip lies in the region of the proximal SVC.  The pulmonary interstitial markings of both lungs are mildly increased but are somewhat less conspicuous than on the previous study. Confluent density in the right infrahilar region and in the retrocardiac region is present and slightly improved. The cardiopericardial silhouette is not enlarged. The pulmonary vascularity is indistinct.  IMPRESSION: 1. There is positioning of the 3 left-sided chest tubes is as described. There is no evidence of a pneumothorax. A small amount of pleural fluid is suspected. 2. The interstitial markings of both lungs are mildly increased but have improved. Confluent density inferiorly, bilaterally is also less conspicuous consistent with resolving atelectasis.   Electronically Signed   By: David  [place]   On: 08/06/2013 13:33   Dg Chest Port 1 View  08/06/2013   CLINICAL DATA:  Pneumonia, ARDS  EXAM: PORTABLE CHEST  - 1 VIEW  COMPARISON:  08/05/2013  FINDINGS: No change in enlarged cardiac silhouette or position of right central line. No change in bilateral hazy opacity consistent with underlying pleural effusions and associated consolidation in the mid to lower lung zones bilaterally.  IMPRESSION: Stable bilateral opacities reflecting known pleural effusions and consolidations   Electronically Signed   By: Esperanza Heiraymond  Rubner M.D.   On: 08/06/2013 08:18   Dg Chest Port 1 View  08/05/2013   CLINICAL DATA:  Status post thoracentesis on the left  EXAM: PORTABLE CHEST - 1 VIEW  COMPARISON:  CT scan of the chest and portable chest x-ray of today's date.  FINDINGS: The lungs are reasonably well inflated. The interstitial markings remain increased bilaterally. There is slightly less density on the left consistent with aspiration of pleural fluid but some but a  moderate amount of fluid likely remains bilaterally. The cardiopericardial silhouette is enlarged. The central pulmonary vascularity is prominent. There is alveolar infiltrate in the right infrahilar region. The right internal jugular venous catheter tip lies in the region of the junction of the proximal and mid SVC and is stable. There is gas within bowel in the left upper quadrant of the abdomen.  IMPRESSION: 1. There is no evidence of a post procedure pneumothorax on the left. A small to moderate amount of pleural fluid remains bilaterally. 2. The cardiopericardial silhouette and pulmonary vascularity remain prominent consistent with CHF. 3. Alveolar density in the right infrahilar region is worrisome for pneumonia.   Electronically Signed   By: David  [place]   On: 08/05/2013 14:06     Assessment/Plan: Group A strep nec fasc/sepsis = continue with clindamycin and amp/sub  Leukocytosis = likely stress response rather than worsening disease  Sandar Krinke, [facility] Cell: [phone] Pager: [phone]  08/07/2013, 10:07  AM

## 2013-08-07 NOTE — Progress Notes (Addendum)
      [address]       [place], [id number]             [phone]        CARDIOTHORACIC SURGERY PROGRESS NOTE   R1 Day Post-Op Procedure(s) (LRB): VIDEO ASSISTED THORACOSCOPY (VATS)/EMPYEMA (Left)  Subjective: Looks and feels much better  Objective: Vital signs: BP Readings from Last 1 Encounters:  08/07/13 102/66   Pulse Readings from Last 1 Encounters:  08/07/13 99   Resp Readings from Last 1 Encounters:  08/07/13 42   Temp Readings from Last 1 Encounters:  08/07/13 99.4 F (37.4 C) Oral    Hemodynamics: CVP:  [4 mmHg] 4 mmHg  Physical Exam:  Rhythm:   sinus  Breath sounds: Diminished at bases  Heart sounds:  RRR  Incisions:  Dressing intact, cellulitis has dramatically improved  Abdomen:  Soft, non-distended, non-tender  Extremities:  Warm, well-perfused   Intake/Output from previous day: 01/18 0701 - 01/19 0700 In: 2180.7 [P.O.:720; I.V.:1010.7; IV Piggyback:450] Out: 2205 [Urine:1505; Blood:50; Chest Tube:650] Intake/Output this shift: Total I/O In: -  Out: 175 [Urine:175]  Lab Results:  CBC: Recent Labs  08/06/13 0449 08/07/13 0400  WBC 21.2* 27.2*  HGB 10.2* 11.5*  HCT 29.1* 32.9*  PLT 128* 144*    BMET:  Recent Labs  08/06/13 0449 08/07/13 0400  NA 134* 135*  K 3.0* 3.5*  CL 96 100  CO2 27 25  GLUCOSE 128* 103*  BUN 24* 22  CREATININE 0.90 0.81  CALCIUM 7.2* 6.9*     CBG (last 3)  No results found for this basename: GLUCAP,  in the last 72 hours  ABG    Component Value Date/Time   O2SAT 69.8 08/05/2013 1045    CXR: CLINICAL DATA: Empyema  EXAM:  PORTABLE CHEST - 1 VIEW  COMPARISON: August 06, 2013  FINDINGS:  Chest tubes on the left are unchanged in position. Central catheter  tip is in the superior vena cava. No apparent pneumothorax.  There is increase in consolidation throughout most of the left lung.  There is patchy atelectasis in the right base with a small loculated  effusion in the  minor fissure region on the right. The heart is  mildly enlarged with normal pulmonary vascularity. No adenopathy.  IMPRESSION:  Increase consolidation throughout much of the left lung. Stable  cardiomegaly. Small loculated effusion in the right minor fissure.  No pneumothorax apparent.  Electronically Signed  By: Bretta BangWilliam Woodruff M.D.  On: 08/07/2013 08:13   Assessment/Plan: S/P Procedure(s) (LRB): VIDEO ASSISTED THORACOSCOPY (VATS)/EMPYEMA (Left)  Looks dramatically improved Needs to get up and out of bed Probably reasonable to resume VAC therapy for open wound Continue IV antibiotics and chest tubes for now Potentially ready for transfer to step-down (3S) soon  OWEN,CLARENCE H 08/07/2013 8:54 AM   Wound dressing removed at bedside and wound VAC replaced.  Wound appears clean with no significant necrotic tissue.  Will plan to change on MWF schedule with next change by Wound Care nurse on Wednesday.  OWEN,CLARENCE H 08/07/2013 2:11 PM

## 2013-08-07 NOTE — Progress Notes (Signed)
Name: [name] [name] MRN: [id number] DOB: [birth year]    ADMISSION DATE:  08/03/2013  REFERRING MD :  EDP  PRIMARY SERVICE: PCCM  CHIEF COMPLAINT:  Abscess   BRIEF PATIENT DESCRIPTION: 23 yo male without medical history admitted 1/15 with large L chest wall abscess with gaseous formation concerning for necrotizing fascitis.   SIGNIFICANT EVENTS / STUDIES:  1/15 CT abd/pelvis >>> 6.6x2.7cm L anterior chest wall abscess with marked edema throughout, gas formation and ?nec fasc  1/17 Thoracentesis >>> 1000 mL clear yellow fluid LEFT drained 1/18 VATS >>> 1200 cc empyema drained, 3 chest tubes placed 1/18 ECHO >>> EF 60-65%, small pericardial effusion  LINES / TUBES:  R IJ CVL 1/15 >>> Chest tube x3 1/18 >>> Wound chest dressings >>>1/16  CULTURES: 1/15  Blood >>> 1/16 wound cult >>> prelim GPC chains and pairs 1/17 pleural fluid >>> group A strep pyogenes  ANTIBIOTICS: Vanc 1/15 >>>1/18 Zosyn 1/15 >>> 1/17 Imipenem 1/17 >>>1/18 Clindamycin 1/15 >>> Unasyn 1/18 >>>  INTERVAL HISTORY:  S/P VATS yesterday, 3 chest tubes in place to suction with minimal drainage. Patient sleepy but not in acute distress. Less pain and less SOB than yesterday. Would like to eat, tolerated jello and liquid diet last night. Has not been up to chair yet.   VITAL SIGNS: Temp:  [97.7 F (36.5 C)-99 F (37.2 C)] 98.3 F (36.8 C) (01/19 0404) Pulse Rate:  [55-112] 99 (01/19 0600) Resp:  [20-51] 42 (01/19 0600) BP: (90-118)/(48-81) 102/66 mmHg (01/19 0600) SpO2:  [91 %-99 %] 93 % (01/19 0600) Arterial Line BP: (86-168)/(58-83) 147/73 mmHg (01/19 0600) Weight:  [183 lb 6.8 oz (83.2 kg)] 183 lb 6.8 oz (83.2 kg) (01/19 0500)  HEMODYNAMICS: CVP:  [4 mmHg] 4 mmHg  VENTILATOR SETTINGS:   INTAKE / OUTPUT: Intake/Output     01/18 0701 - 01/19 0700 01/19 0701 - 01/20 0700   P.O. 720    I.V. (mL/kg) 1010.7 (12.1)    IV Piggyback 450    Total Intake(mL/kg) 2180.7 (26.2)    Urine (mL/kg/hr) 1505 (0.8)     Drains     Blood 50 (0)    Chest Tube 650 (0.3)    Total Output 2205     Net -24.3           PHYSICAL EXAMINATION: General:  Resting comfortably Neuro:  alert, appropriate HEENT:  Mm moist, no JVD  Cardiovascular:  s1s2 rrr Lungs: no distress, 3 chest tubes out left chest to suction Abdomen:  Soft, +bs  Musculoskeletal:  L chest wall/shoulder with wound dressing in place, still with rash on chest wall  LABS:  CBC  Recent Labs Lab 08/05/13 0447 08/06/13 0449 08/07/13 0400  WBC 29.6* 21.2* 27.2*  HGB 11.2* 10.2* 11.5*  HCT 32.4* 29.1* 32.9*  PLT 142* 128* 144*   Coag's  Recent Labs Lab 08/03/13 1840  INR 1.34   BMET  Recent Labs Lab 08/04/13 0415 08/06/13 0449 08/07/13 0400  NA 132* 134* 135*  K 3.9 3.0* 3.5*  CL 100 96 100  CO2 19 27 25   BUN 29* 24* 22  CREATININE 1.17 0.90 0.81  GLUCOSE 131* 128* 103*   Electrolytes  Recent Labs Lab 08/03/13 1840 08/04/13 0415 08/06/13 0449 08/07/13 0400  CALCIUM 7.8* 6.5* 7.2* 6.9*  MG 2.1  --   --  2.2  PHOS 3.7  --   --   --    Sepsis Markers  Recent Labs Lab 08/03/13 1840 08/03/13 2200 08/04/13 1300  LATICACIDVEN  3.6* 2.9* 3.3*  PROCALCITON 27.51  --   --    ABG No results found for this basename: PHART, PCO2ART, PO2ART,  in the last 168 hours  Liver Enzymes  Recent Labs Lab 08/03/13 1840 08/06/13 0449  AST 39* 81*  ALT 15 31  ALKPHOS 68 78  BILITOT 2.0* 1.1  ALBUMIN 1.8* 1.3*   Cardiac Enzymes No results found for this basename: TROPONINI, PROBNP,  in the last 168 hours Glucose  Recent Labs Lab 08/03/13 1646 08/03/13 1903  GLUCAP 91 148*   CXR: 1/19 Stable, 3 chest tubes in place  ASSESSMENT / PLAN:  PULMONARY A:  ALI pattern, L empyema Acute hypoxemic Resp failure P:   CVTS to manage chest tubes Oxygen prn  CARDIOVASCULAR A:  Septic shock. Off pressors. P:  Goal MAP > 65  RENAL A:   Mild hyponatremia. Mild Hypokalemia. P:   Trend BMP PO K  replacement  GASTROINTESTINAL A:   Nutrition. GI Px. P:   Diet as tolerated  HEMATOLOGIC A:   DVT Px P:  Trend CBC Heparin [name]  INFECTIOUS A:   Chest wall abscess. No evident nec fasc Empyema L pleural space, pos org on c/s Wound pos Strep Group A pyogenes , suspect skin source P:   Appreciate ID recs Abx as above  ENDOCRINE A:   No active. P:   Monitor  NEUROLOGIC A:   Pain. P:   Tramadol and Oxy IR PO  Genella MechKOLLAR, ELIZABETH, PGY-3 7:42 AM,08/07/2013   PCCM ATTENDING: I have interviewed and examined the patient and reviewed the database. I have formulated the assessment and plan as reflected in the note above with amendments made by me. Discussed with Dr Cornelius Moraswen. Transfer to SDU. Keep on PCCM  Billy Fischeravid Simonds, MD;  PCCM service; Mobile [phone]

## 2013-08-08 ENCOUNTER — Inpatient Hospital Stay (HOSPITAL_COMMUNITY): Payer: Managed Care, Other (non HMO)

## 2013-08-08 ENCOUNTER — Encounter (HOSPITAL_COMMUNITY): Payer: Self-pay | Admitting: Cardiothoracic Surgery

## 2013-08-08 LAB — DIFFERENTIAL
Basophils Absolute: 0 10*3/uL (ref 0.0–0.1)
Basophils Relative: 0 % (ref 0–1)
Eosinophils Absolute: 0 10*3/uL (ref 0.0–0.7)
Eosinophils Relative: 0 % (ref 0–5)
Lymphocytes Relative: 6 % — ABNORMAL LOW (ref 12–46)
Lymphs Abs: 1.9 10*3/uL (ref 0.7–4.0)
Monocytes Absolute: 1.6 10*3/uL — ABNORMAL HIGH (ref 0.1–1.0)
Monocytes Relative: 5 % (ref 3–12)
Neutro Abs: 28 10*3/uL — ABNORMAL HIGH (ref 1.7–7.7)
Neutrophils Relative %: 89 % — ABNORMAL HIGH (ref 43–77)

## 2013-08-08 LAB — CBC
HCT: 31.3 % — ABNORMAL LOW (ref 39.0–52.0)
Hemoglobin: 10.9 g/dL — ABNORMAL LOW (ref 13.0–17.0)
MCH: 29.5 pg (ref 26.0–34.0)
MCHC: 34.8 g/dL (ref 30.0–36.0)
MCV: 84.8 fL (ref 78.0–100.0)
Platelets: 160 10*3/uL (ref 150–400)
RBC: 3.69 MIL/uL — ABNORMAL LOW (ref 4.22–5.81)
RDW: 13.6 % (ref 11.5–15.5)
WBC: 31.5 10*3/uL — ABNORMAL HIGH (ref 4.0–10.5)

## 2013-08-08 NOTE — Progress Notes (Signed)
2 Days Post-Op Procedure(s) (LRB): VIDEO ASSISTED THORACOSCOPY (VATS)/EMPYEMA (Left) Subjective: Condition overall improved, moved to step down Patient tolerated greater diet starting to walk Minimal chest tube drainage, minimal wound VAC drainage  Objective: Vital signs in last 24 hours: Temp:  [98.2 F (36.8 C)-99.7 F (37.6 C)] 98.7 F (37.1 C) (01/20 1235) Pulse Rate:  [95-116] 95 (01/20 0335) Cardiac Rhythm:  [-] Normal sinus rhythm (01/20 1235) Resp:  [31-44] 31 (01/20 0335) BP: (101-129)/(63-73) 129/63 mmHg (01/20 0751) SpO2:  [93 %-97 %] 94 % (01/20 0335) Weight:  [195 lb 1.7 oz (88.5 kg)] 195 lb 1.7 oz (88.5 kg) (01/20 0335)  Hemodynamic parameters for last 24 hours:   Sinus rhythm afebrile Intake/Output from previous day: 01/19 0701 - 01/20 0700 In: 720 [P.O.:120; I.V.:300; IV Piggyback:300] Out: 1455 [Urine:1225; Drains:10; Chest Tube:220] Intake/Output this shift: Total I/O In: -  Out: 10 [Chest Tube:10]  Lungs clear  Lab Results:  Recent Labs  08/07/13 0400 08/08/13 0240  WBC 27.2* 31.5*  HGB 11.5* 10.9*  HCT 32.9* 31.3*  PLT 144* 160   BMET:  Recent Labs  08/06/13 0449 08/07/13 0400  NA 134* 135*  K 3.0* 3.5*  CL 96 100  CO2 27 25  GLUCOSE 128* 103*  BUN 24* 22  CREATININE 0.90 0.81  CALCIUM 7.2* 6.9*    PT/INR: No results found for this basename: LABPROT, INR,  in the last 72 hours ABG    Component Value Date/Time   O2SAT 69.8 08/05/2013 1045   CBG (last 3)  No results found for this basename: GLUCAP,  in the last 72 hours  Assessment/Plan: S/P Procedure(s) (LRB): VIDEO ASSISTED THORACOSCOPY (VATS)/EMPYEMA (Left) Continue wound VAC drainage of chest wall abscess Remove anterior chest tube Daily chest x-ray all chest tubes in place   LOS: 5 days    [name] [name] [name] [name] [name] 08/08/2013

## 2013-08-08 NOTE — Consult Note (Addendum)
WOC consulted for NPWT VAC placement, contacted nursing unit to clarify orders.  Pending replacement of VAC to surgical site 08/07/13.  Bedside nursing staff to place if needed before WOC nurse back on this campus. Davina PokeM. Ivie Savitt RN,CWOCN [phone]  08/08/13 Contacted nursing unit this am, NPWT VAC orders obtained and bedside nursing replaced dressing and restarted NPWT to surgical site.  Bedside nursing ok to manage dressing, contact WOC if needed for assistance.  Re consult if needed, will not follow at this time. Thanks  Pandora Mccrackin [employer] RN, CWOCN [phone])

## 2013-08-08 NOTE — Progress Notes (Signed)
Name: [name] [name] MRN: [id number] DOB: [birth year]    ADMISSION DATE:  08/03/2013  REFERRING MD :  EDP  PRIMARY SERVICE: PCCM  CHIEF COMPLAINT:  Abscess   BRIEF PATIENT DESCRIPTION: 23 yo male without medical history admitted 1/15 with large L chest wall abscess with gaseous formation concerning for necrotizing fascitis.   SIGNIFICANT EVENTS / STUDIES:  1/15 CT abd/pelvis >>> 6.6x2.7cm L anterior chest wall abscess with marked edema throughout, gas formation and ?nec fasc  1/17 Thoracentesis >>> 1000 mL clear yellow fluid LEFT drained 1/18 VATS >>> 1200 cc empyema drained, 3 chest tubes placed 1/18 ECHO >>> EF 60-65%, small pericardial effusion  LINES / TUBES: R IJ CVL 1/15 >>> 1/19 Chest tube x3 1/18 >>> Wound chest dressings >>>1/16  CULTURES: 1/15  Blood >>> 1/16 wound cult >>> prelim GPC chains and pairs 1/17 pleural fluid >>> group A strep pyogenes  ANTIBIOTICS: Vanc 1/15 >>>1/18 Zosyn 1/15 >>> 1/17 Imipenem 1/17 >>>1/18 Clindamycin 1/15 >>> Unasyn 1/18 >>>  INTERVAL HISTORY:  Denies dyspnea.  Tolerating diet.  Chest pain improved.  VITAL SIGNS: Temp:  [98.2 F (36.8 C)-99.7 F (37.6 C)] 98.2 F (36.8 C) (01/20 0751) Pulse Rate:  [91-116] 95 (01/20 0335) Resp:  [28-46] 31 (01/20 0335) BP: (95-129)/(63-75) 129/63 mmHg (01/20 0751) SpO2:  [93 %-98 %] 94 % (01/20 0335) Arterial Line BP: (127)/(68) 127/68 mmHg (01/19 1200) Weight:  [195 lb 1.7 oz (88.5 kg)] 195 lb 1.7 oz (88.5 kg) (01/20 0335) 3 liters [place] INTAKE / OUTPUT: Intake/Output     01/19 0701 - 01/20 0700 01/20 0701 - 01/21 0700   P.O. 120    I.V. (mL/kg) 300 (3.4)    IV Piggyback 300    Total Intake(mL/kg) 720 (8.1)    Urine (mL/kg/hr) 1225 (0.6)    Drains 10 (0)    Blood     Chest Tube 220 (0.1) 10 (0)   Total Output 1455 10   Net -735 -10         PHYSICAL EXAMINATION: General:  Resting comfortably Neuro:  alert, appropriate HEENT:  Mm moist, no JVD  Cardiovascular:  s1s2 rrr Lungs: no  distress, 3 chest tubes out left chest to suction Abdomen:  Soft, +bs  Musculoskeletal:  L chest wall/shoulder with wound dressing in place, still with rash on chest wall  LABS:  CBC  Recent Labs Lab 08/06/13 0449 08/07/13 0400 08/08/13 0240  WBC 21.2* 27.2* 31.5*  HGB 10.2* 11.5* 10.9*  HCT 29.1* 32.9* 31.3*  PLT 128* 144* 160   Coag's  Recent Labs Lab 08/03/13 1840  INR 1.34   BMET  Recent Labs Lab 08/04/13 0415 08/06/13 0449 08/07/13 0400  NA 132* 134* 135*  K 3.9 3.0* 3.5*  CL 100 96 100  CO2 19 27 25   BUN 29* 24* 22  CREATININE 1.17 0.90 0.81  GLUCOSE 131* 128* 103*   Electrolytes  Recent Labs Lab 08/03/13 1840 08/04/13 0415 08/06/13 0449 08/07/13 0400  CALCIUM 7.8* 6.5* 7.2* 6.9*  MG 2.1  --   --  2.2  PHOS 3.7  --   --   --    Sepsis Markers  Recent Labs Lab 08/03/13 1840 08/03/13 2200 08/04/13 1300  LATICACIDVEN 3.6* 2.9* 3.3*  PROCALCITON 27.51  --   --    Liver Enzymes  Recent Labs Lab 08/03/13 1840 08/06/13 0449  AST 39* 81*  ALT 15 31  ALKPHOS 68 78  BILITOT 2.0* 1.1  ALBUMIN 1.8* 1.3*   Glucose  Recent Labs Lab 08/03/13 1646 08/03/13 1903  GLUCAP 91 148*   CXR: Dg Chest Port 1 View  08/08/2013   CLINICAL DATA:  Empyema.  EXAM: PORTABLE CHEST - 1 VIEW  COMPARISON:  08/07/2013  FINDINGS: No change in 3 drainage catheters over the left lower thorax. Lungs are hypoinflated with continued opacification of the left lung and right mid to lower lung without significant change. Remainder of the exam is unchanged.  IMPRESSION: Continued opacification over the left lung and right mid to lower lung without significant change. Three drainage catheters unchanged over the left lower thorax.   Electronically Signed   By: Elberta Fortis M.D.   On: 08/08/2013 07:39   Dg Chest Port 1 View  08/07/2013   CLINICAL DATA:  Empyema  EXAM: PORTABLE CHEST - 1 VIEW  COMPARISON:  August 06, 2013  FINDINGS: Chest tubes on the left are unchanged in  position. Central catheter tip is in the superior vena cava. No apparent pneumothorax.  There is increase in consolidation throughout most of the left lung. There is patchy atelectasis in the right base with a small loculated effusion in the minor fissure region on the right. The heart is mildly enlarged with normal pulmonary vascularity. No adenopathy.  IMPRESSION: Increase consolidation throughout much of the left lung. Stable cardiomegaly. Small loculated effusion in the right minor fissure. No pneumothorax apparent.   Electronically Signed   By: Bretta Bang M.D.   On: 08/07/2013 08:13   Dg Chest Portable 1 View  08/06/2013   EXAM: PORTABLE CHEST - 1 VIEW  COMPARISON:  Chest x-ray of earlier today at 6:13 a.m.  FINDINGS: The lungs are reasonably well inflated. There is no pneumothorax on the left. There is likely pleural fluid on the left. Along the lateral thoracic wall and in the apex. There are 3 large caliber chest tubes present. The uppermost tube has its tip projecting over the posterior lateral aspect of the left seventh rib adjacent to the cardiac apex. The second tube has its tip oriented inferiorly likely in the posterior costophrenic gutter. The third tube tip also is oriented inferiorly but also more medially into the costophrenic gutter. The right internal jugular venous catheter tip lies in the region of the proximal SVC.  The pulmonary interstitial markings of both lungs are mildly increased but are somewhat less conspicuous than on the previous study. Confluent density in the right infrahilar region and in the retrocardiac region is present and slightly improved. The cardiopericardial silhouette is not enlarged. The pulmonary vascularity is indistinct.  IMPRESSION: 1. There is positioning of the 3 left-sided chest tubes is as described. There is no evidence of a pneumothorax. A small amount of pleural fluid is suspected. 2. The interstitial markings of both lungs are mildly increased but  have improved. Confluent density inferiorly, bilaterally is also less conspicuous consistent with resolving atelectasis.   Electronically Signed   By: David  [place]   On: 08/06/2013 13:33     ASSESSMENT / PLAN:  PULMONARY A:  Acute respiratory failure 2nd to Group A Strep cellulitis with Lt empyema.  S/p VATS. P:   -Abx per ID -chest tubes per TCTS -f/u CXR 1/21  A:  Septic shock >> resolved. P:  -monitor hemodynamics  A:   Mild hyponatremia. Mild Hypokalemia. P:   -Trend BMP  A: Leukocytosis. P: -f/u CBC  A:   Post op pain. P:   -prn pain meds  Updated family at bedside.  Will transfer to [facility]  1/21 and PCCM sign off.  Coralyn Helling, MD [facility] Pulmonary/Critical Care 08/08/2013, 11:57 AM Pager:  [phone] After 3pm call: [phone]

## 2013-08-08 NOTE — Progress Notes (Signed)
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 He had VATS on 1/18. Renal function is stable.  1/18 pleural fluid> ngtd 1/17 pleural fluid> ngtd 1/16 abscess> mod group A strep; no anaerobes 1/16 Tissue Cx> Group A strep (S. Pyogenes) 1/15 BCx> ngtd - but drawn after abx  1/15 Vanc> 1/17  1/15 Zosyn>1/17 1/15 Clindamycin> 1/17 Primaxin > 1/18 1/18 Unasyn >   Plan: 1) Continue clindamycin 600mg  IV q6 2) Continue unasyn 3g IV q6  Louie CasaJennifer Delaina Fetsch, PharmD, BCPS 08/08/2013, 11:03 AM

## 2013-08-08 NOTE — Progress Notes (Signed)
2 Days Post-Op  Subjective: Continues to feel better  Objective: Vital signs in last 24 hours: Temp:  [98.2 F (36.8 C)-99.7 F (37.6 C)] 98.2 F (36.8 C) (01/20 0751) Pulse Rate:  [91-116] 95 (01/20 0335) Resp:  [28-46] 31 (01/20 0335) BP: (92-129)/(63-75) 129/63 mmHg (01/20 0751) SpO2:  [91 %-98 %] 94 % (01/20 0335) Arterial Line BP: (120-138)/(68-77) 127/68 mmHg (01/19 1200) Weight:  [195 lb 1.7 oz (88.5 kg)] 195 lb 1.7 oz (88.5 kg) (01/20 0335) Last BM Date: 08/02/13  Intake/Output from previous day: 01/19 0701 - 01/20 0700 In: 720 [P.O.:120; I.V.:300; IV Piggyback:300] Out: 1455 [Urine:1225; Drains:10; Chest Tube:220] Intake/Output this shift: Total I/O In: -  Out: 10 [Chest Tube:10]  Erythema along side and flank toward abdomen and groin resolved Scrotum edematous but not infectious  Lab Results:   Recent Labs  08/07/13 0400 08/08/13 0240  WBC 27.2* 31.5*  HGB 11.5* 10.9*  HCT 32.9* 31.3*  PLT 144* 160   BMET  Recent Labs  08/06/13 0449 08/07/13 0400  NA 134* 135*  K 3.0* 3.5*  CL 96 100  CO2 27 25  GLUCOSE 128* 103*  BUN 24* 22  CREATININE 0.90 0.81  CALCIUM 7.2* 6.9*   PT/INR No results found for this basename: LABPROT, INR,  in the last 72 hours ABG No results found for this basename: PHART, PCO2, PO2, HCO3,  in the last 72 hours  Studies/Results: Dg Chest Port 1 View  08/08/2013   CLINICAL DATA:  Empyema.  EXAM: PORTABLE CHEST - 1 VIEW  COMPARISON:  08/07/2013  FINDINGS: No change in 3 drainage catheters over the left lower thorax. Lungs are hypoinflated with continued opacification of the left lung and right mid to lower lung without significant change. Remainder of the exam is unchanged.  IMPRESSION: Continued opacification over the left lung and right mid to lower lung without significant change. Three drainage catheters unchanged over the left lower thorax.   Electronically Signed   By: Elberta Fortisaniel  Boyle M.D.   On: 08/08/2013 07:39   Dg Chest  Port 1 View  08/07/2013   CLINICAL DATA:  Empyema  EXAM: PORTABLE CHEST - 1 VIEW  COMPARISON:  August 06, 2013  FINDINGS: Chest tubes on the left are unchanged in position. Central catheter tip is in the superior vena cava. No apparent pneumothorax.  There is increase in consolidation throughout most of the left lung. There is patchy atelectasis in the right base with a small loculated effusion in the minor fissure region on the right. The heart is mildly enlarged with normal pulmonary vascularity. No adenopathy.  IMPRESSION: Increase consolidation throughout much of the left lung. Stable cardiomegaly. Small loculated effusion in the right minor fissure. No pneumothorax apparent.   Electronically Signed   By: Bretta BangWilliam  Woodruff M.D.   On: 08/07/2013 08:13   Dg Chest Portable 1 View  08/06/2013   EXAM: PORTABLE CHEST - 1 VIEW  COMPARISON:  Chest x-ray of earlier today at 6:13 a.m.  FINDINGS: The lungs are reasonably well inflated. There is no pneumothorax on the left. There is likely pleural fluid on the left. Along the lateral thoracic wall and in the apex. There are 3 large caliber chest tubes present. The uppermost tube has its tip projecting over the posterior lateral aspect of the left seventh rib adjacent to the cardiac apex. The second tube has its tip oriented inferiorly likely in the posterior costophrenic gutter. The third tube tip also is oriented inferiorly but also more medially  into the costophrenic gutter. The right internal jugular venous catheter tip lies in the region of the proximal SVC.  The pulmonary interstitial markings of both lungs are mildly increased but are somewhat less conspicuous than on the previous study. Confluent density in the right infrahilar region and in the retrocardiac region is present and slightly improved. The cardiopericardial silhouette is not enlarged. The pulmonary vascularity is indistinct.  IMPRESSION: 1. There is positioning of the 3 left-sided chest tubes is as  described. There is no evidence of a pneumothorax. A small amount of pleural fluid is suspected. 2. The interstitial markings of both lungs are mildly increased but have improved. Confluent density inferiorly, bilaterally is also less conspicuous consistent with resolving atelectasis.   Electronically Signed   By: David  [place]   On: 08/06/2013 13:33    Anti-infectives: Anti-infectives   Start     Dose/Rate Route Frequency Ordered Stop   08/07/13 1445  vancomycin (VANCOCIN) powder 1,000 mg     1,000 mg Other To Surgery 08/07/13 1434 08/08/13 1445   08/06/13 1325  cefUROXime (ZINACEF) 1.5 g in dextrose 5 % 50 mL IVPB  Status:  Discontinued     1.5 g 100 mL/hr over 30 Minutes Intravenous 60 min pre-op 08/06/13 1326 08/06/13 1336   08/06/13 1200  Ampicillin-Sulbactam (UNASYN) 3 g in sodium chloride 0.9 % 100 mL IVPB     3 g 100 mL/hr over 60 Minutes Intravenous Every 6 hours 08/06/13 1103     08/05/13 1300  [MAR Hold]  imipenem-cilastatin (PRIMAXIN) 500 mg in sodium chloride 0.9 % 100 mL IVPB  Status:  Discontinued     (On MAR Hold since 08/06/13 0951)   500 mg 200 mL/hr over 30 Minutes Intravenous 4 times per day 08/05/13 1259 08/06/13 1103   08/04/13 1012  vancomycin (VANCOCIN) 1,000 mg in sodium chloride 0.9 % 1,000 mL irrigation  Status:  Discontinued       As needed 08/04/13 1012 08/04/13 1035   08/04/13 0300  [MAR Hold]  vancomycin (VANCOCIN) IVPB 1000 mg/200 mL premix  Status:  Discontinued     (On MAR Hold since 08/06/13 0951)   1,000 mg 200 mL/hr over 60 Minutes Intravenous Every 8 hours 08/03/13 1931 08/06/13 1103   08/03/13 1830  piperacillin-tazobactam (ZOSYN) IVPB 3.375 g  Status:  Discontinued     3.375 g 12.5 mL/hr over 240 Minutes Intravenous Every 8 hours 08/03/13 1654 08/05/13 1255   08/03/13 1800  vancomycin (VANCOCIN) IVPB 1000 mg/200 mL premix     1,000 mg 200 mL/hr over 60 Minutes Intravenous  Once 08/03/13 1654 08/03/13 1949   08/03/13 1800  clindamycin (CLEOCIN) IVPB  600 mg  Status:  Discontinued     600 mg 100 mL/hr over 30 Minutes Intravenous 4 times per day 08/03/13 1709 08/03/13 1716   08/03/13 1800  clindamycin (CLEOCIN) IVPB 600 mg     600 mg 100 mL/hr over 30 Minutes Intravenous 4 times per day 08/03/13 1717        Assessment/Plan: s/p Procedure(s): VIDEO ASSISTED THORACOSCOPY (VATS)/EMPYEMA (Left)  I don't believe that the fasciitis has spread to his abdominal wall and flank. Will sign off.  Please call us back if things change  LOS: 5 days    Rashi Granier A 08/08/2013

## 2013-08-08 NOTE — Procedures (Signed)
Staff MD  I was NOT present for procedure   Dr. Kalman ShanMurali Ermalinda Joubert, M.D., [facility] CountyF.C.C.P Pulmonary and Critical Care Medicine Staff Physician [facility] [place] Pulmonary and Critical Care Pager: [phone], If no answer or between  15:00h - 7:00h: call [phone]  08/08/2013 10:57 AM

## 2013-08-08 NOTE — Progress Notes (Signed)
[facility]    Date of Admission:  08/03/2013   Total days of antibiotics 6        Day 6clinda        Day 3 amp/sub           ID: [name] [name] is a 23 y.o. 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 ampicillin-sulbactam (UNASYN) IV  3 g Intravenous Q6H  . clindamycin (CLEOCIN) IV  600 mg Intravenous Q6H  . feeding supplement (ENSURE COMPLETE)  237 mL Oral TID BM  . influenza vac split quadrivalent PF  0.5 mL Intramuscular Tomorrow-1000    Objective: Vital signs in last 24 hours: Temp:  [98.2 F (36.8 C)-99.7 F (37.6 C)] 98.7 F (37.1 C) (01/20 1235) Pulse Rate:  [95-116] 95 (01/20 0335) Resp:  [31-44] 31 (01/20 0335) BP: (101-129)/(63-73) 129/63 mmHg (01/20 0751) SpO2:  [93 %-97 %] 94 % (01/20 0335) Weight:  [195 lb 1.7 oz (88.5 kg)] 195 lb 1.7 oz (88.5 kg) (01/20 0335) Physical Exam  Constitutional: oriented to person, place, and time. He appears well-developed and well-nourished. No distress.  HENT:  Mouth/Throat: Oropharynx is clear and moist. No oropharyngeal exudate.  Cardiovascular: tachycardic and normal heart sounds. Exam reveals no gallop and no friction rub.  No murmur heard.  Pulmonary/Chest: Effort normal and breath sounds normal. No respiratory distress. He has no wheezes. Left chest tube and ant chest wall bandaged. Chest tube output serous appearing Abdominal: Soft. Bowel sounds are normal. He exhibits no distension. There is no tenderness.  Lymphadenopathy:  no cervical adenopathy.  Gu=  scrotal edema Neurological: alert and oriented to person, place, and time.  Skin: Skin is warm and dry. No rash noted. No erythema.  Psychiatric:  a normal mood and affect.  behavior is normal.    Lab Results  Recent Labs  08/06/13 0449 08/07/13 0400 08/08/13 0240  WBC 21.2* 27.2* 31.5*  HGB 10.2* 11.5* 10.9*  HCT 29.1* 32.9* 31.3*  NA 134* 135*  --   K 3.0* 3.5*  --   CL 96 100  --   CO2 27 25  --   BUN 24* 22  --   CREATININE 0.90 0.81  --    Liver Panel  Recent Labs  08/06/13 0449  PROT 4.8*  ALBUMIN 1.3*  AST 81*  ALT 31  ALKPHOS 78  BILITOT 1.1    Microbiology: 1/18 left pleural fluid NGTD 1/17 pleural fluid = gpc in prs. No growth 1/16 abscess = group a strep 1/15 blood cx = NGTD  Studies/Results: Dg Chest Port 1 View  08/08/2013   CLINICAL DATA:  Empyema.  EXAM: PORTABLE CHEST - 1 VIEW  COMPARISON:  08/07/2013  FINDINGS: No change in 3 drainage catheters over the left lower thorax. Lungs are hypoinflated with continued opacification of the left lung and right mid to lower lung without significant change. Remainder of the exam is unchanged.  IMPRESSION: Continued opacification over the left lung and right mid to lower lung without significant change. Three drainage  catheters unchanged over the left lower thorax.   Electronically Signed   By: Elberta Fortisaniel  Boyle M.D.   On: 08/08/2013 07:39   Dg Chest Port 1 View  08/07/2013   CLINICAL DATA:  Empyema  EXAM: PORTABLE CHEST - 1 VIEW  COMPARISON:  August 06, 2013  FINDINGS: Chest tubes on the left are unchanged in position. Central catheter tip is in the superior vena cava. No apparent pneumothorax.  There is increase in consolidation throughout most of the left lung. There is patchy atelectasis in the right base with a small loculated effusion in the minor fissure region on the right. The heart is mildly enlarged with normal pulmonary vascularity. No adenopathy.  IMPRESSION: Increase consolidation throughout much of the  left lung. Stable cardiomegaly. Small loculated effusion in the right minor fissure. No pneumothorax apparent.   Electronically Signed   By: Bretta BangWilliam  Woodruff M.D.   On: 08/07/2013 08:13     Assessment/Plan: Group A strep nec fasc/sepsis = continue with clindamycin and amp/sub. Will likely discontinue clindamycin after tomorrow. Initially used for toxin inhibition  Leukocytosis = likely stress response rather than worsening disease. Will continue to monitor.  Drue SecondSNIDER, [facility] LPCYNTHIA [facility] Cell: [phone] Pager: [phone]  08/08/2013, 4:03 PM

## 2013-08-09 ENCOUNTER — Inpatient Hospital Stay (HOSPITAL_COMMUNITY): Payer: Managed Care, Other (non HMO)

## 2013-08-09 DIAGNOSIS — R509 Fever, unspecified: Secondary | ICD-10-CM

## 2013-08-09 LAB — CULTURE, BLOOD (ROUTINE X 2)
Culture: NO GROWTH
Culture: NO GROWTH

## 2013-08-09 LAB — BASIC METABOLIC PANEL
BUN: 17 mg/dL (ref 6–23)
CALCIUM: 6.9 mg/dL — AB (ref 8.4–10.5)
CO2: 24 mEq/L (ref 19–32)
Chloride: 99 mEq/L (ref 96–112)
Creatinine, Ser: 0.78 mg/dL (ref 0.50–1.35)
Glucose, Bld: 108 mg/dL — ABNORMAL HIGH (ref 70–99)
POTASSIUM: 3.8 meq/L (ref 3.7–5.3)
SODIUM: 134 meq/L — AB (ref 137–147)

## 2013-08-09 LAB — ANAEROBIC CULTURE

## 2013-08-09 LAB — URINALYSIS, ROUTINE W REFLEX MICROSCOPIC
Bilirubin Urine: NEGATIVE
Glucose, UA: NEGATIVE mg/dL
Hgb urine dipstick: NEGATIVE
KETONES UR: NEGATIVE mg/dL
LEUKOCYTES UA: NEGATIVE
Nitrite: NEGATIVE
PH: 8 (ref 5.0–8.0)
Protein, ur: 30 mg/dL — AB
SPECIFIC GRAVITY, URINE: 1.027 (ref 1.005–1.030)
UROBILINOGEN UA: 1 mg/dL (ref 0.0–1.0)

## 2013-08-09 LAB — CBC
HCT: 30 % — ABNORMAL LOW (ref 39.0–52.0)
HEMOGLOBIN: 10.3 g/dL — AB (ref 13.0–17.0)
MCH: 29.5 pg (ref 26.0–34.0)
MCHC: 34.3 g/dL (ref 30.0–36.0)
MCV: 86 fL (ref 78.0–100.0)
PLATELETS: 201 10*3/uL (ref 150–400)
RBC: 3.49 MIL/uL — AB (ref 4.22–5.81)
RDW: 13.6 % (ref 11.5–15.5)
WBC: 22.6 10*3/uL — ABNORMAL HIGH (ref 4.0–10.5)

## 2013-08-09 LAB — BODY FLUID CULTURE: CULTURE: NO GROWTH

## 2013-08-09 LAB — URINE MICROSCOPIC-ADD ON

## 2013-08-09 LAB — PATHOLOGIST SMEAR REVIEW

## 2013-08-09 MED ORDER — FUROSEMIDE 10 MG/ML IJ SOLN
20.0000 mg | Freq: Once | INTRAMUSCULAR | Status: AC
  Administered 2013-08-09: 20 mg via INTRAVENOUS

## 2013-08-09 MED ORDER — OXYCODONE HCL 5 MG PO TABS
5.0000 mg | ORAL_TABLET | ORAL | Status: DC | PRN
  Administered 2013-08-09 – 2013-08-11 (×4): 5 mg via ORAL
  Administered 2013-08-12 (×2): 10 mg via ORAL
  Filled 2013-08-09: qty 2
  Filled 2013-08-09 (×4): qty 1
  Filled 2013-08-09: qty 2

## 2013-08-09 MED ORDER — MORPHINE SULFATE 2 MG/ML IJ SOLN
1.0000 mg | INTRAMUSCULAR | Status: DC | PRN
  Administered 2013-08-09 (×3): 2 mg via INTRAVENOUS
  Filled 2013-08-09 (×3): qty 1

## 2013-08-09 NOTE — Progress Notes (Signed)
      [address]       [place] [id number]             [phone]      3 Days Post-Op Procedure(s) (LRB): VIDEO ASSISTED THORACOSCOPY (VATS)/EMPYEMA (Left)  Subjective:  No new complaints.  Patient states pain isn't too bad.  He is ambulating, tolerating regular diet.  Objective: Vital signs in last 24 hours: Temp:  [98.3 F (36.8 C)-100.1 F (37.8 C)] 98.3 F (36.8 C) (01/21 0700) Pulse Rate:  [84-100] 100 (01/21 0700) Cardiac Rhythm:  [-] Normal sinus rhythm (01/21 0700) Resp:  [27-41] 41 (01/21 0700) BP: (121-132)/(67-75) 128/67 mmHg (01/21 0700) SpO2:  [94 %-97 %] 97 % (01/21 0700) Weight:  [194 lb 10.7 oz (88.3 kg)] 194 lb 10.7 oz (88.3 kg) (01/21 0500)  Intake/Output from previous day: 01/20 0701 - 01/21 0700 In: 370 [P.O.:240; I.V.:130] Out: 505 [Urine:450; Drains:25; Chest Tube:30] Intake/Output this shift: Total I/O In: 10 [I.V.:10] Out: 0   General appearance: alert, cooperative and no distress Heart: regular rate and rhythm Lungs: diminished breath sounds bibasilar Wound: wound vac in place on chest, no surrounding erythema present, chest tube sites are clean and dry  Lab Results:  Recent Labs  08/08/13 0240 08/09/13 0415  WBC 31.5* 22.6*  HGB 10.9* 10.3*  HCT 31.3* 30.0*  PLT 160 201   BMET:  Recent Labs  08/07/13 0400 08/09/13 0415  NA 135* 134*  K 3.5* 3.8  CL 100 99  CO2 25 24  GLUCOSE 103* 108*  BUN 22 17  CREATININE 0.81 0.78  CALCIUM 6.9* 6.9*    PT/INR: No results found for this basename: LABPROT, INR,  in the last 72 hours ABG    Component Value Date/Time   O2SAT 69.8 08/05/2013 1045   CBG (last 3)  No results found for this basename: GLUCAP,  in the last 72 hours  Assessment/Plan: S/P Procedure(s) (LRB): VIDEO ASSISTED THORACOSCOPY (VATS)/EMPYEMA (Left)  1.Chest wall abscess S/P Debridement- wound vac in place, plan to change today 2. Chest tubes- 30 cc output recorded, no air leak present- chest  tube management per staff, CXR with stable appearance 3. ID- Group A Strep nec fasc/sepsis= on Clindamycin, Amp/SuB- Leukocytosis improved today ID following 4. Dispo-plan per primary CXR in AM  LOS: 6 days    Vika Buske, Denny PeonRIN 08/09/2013

## 2013-08-09 NOTE — Progress Notes (Signed)
[facility] TEAM 1 - Stepdown/ICU TEAM Progress Note  [name] [name] WUJ:[id number] DOB: 1991-05-04 DOA: 08/03/2013 PCP: Juliette Alcide, MD  Admit HPI / Brief Narrative: 23 yo male without medical history admitted 1/15 with large L chest wall abscess with gaseous formation concerning for necrotizing fascitis.   SIGNIFICANT EVENTS / STUDIES:  1/15 CT abd/pelvis > 6.6 x 2.7cm  L anterior chest wall abscess with marked edema throughout, gas formation and ?nec fasc  1/17 Thoracentesis > 1000 mL clear yellow fluid LEFT drained  1/18 VATS > 1200 cc empyema drained, 3 chest tubes placed  1/18 ECHO > EF 60-65%, small pericardial effusion  HPI/Subjective: Patient appears to be doing remarkably well.  He reports that his pain is reasonably controlled and he denies shortness of breath.  Denies abdominal pain.  Assessment/Plan:  L anterior chest wall Group A Strep necrotizing fasciitis + abscess w/ empyema  S/p VATS - care per TCTS and ID   Septic shock >> resolved  Mild hyponatremia Follow  Mild Hypokalemia Resolved at this time   Code Status: FULL Family Communication: spoke w/ mother at bedside  Disposition Plan: SDU   Consultants: PCCM >> TRH TCTS ID   Antibiotics: Vanc 1/15 >>>1/18  Zosyn 1/15 >>> 1/17  Imipenem 1/17 >>>1/18  Clindamycin 1/15 > Unasyn 1/18 >  DVT prophylaxis: SCDs  Objective: Blood pressure 128/67, pulse 100, temperature 98.9 F (37.2 C), temperature source Oral, resp. rate 41, height 5\' 10"  (1.778 m), weight 88.3 kg (194 lb 10.7 oz), SpO2 97.00%.  Intake/Output Summary (Last 24 hours) at 08/09/13 1612 Last data filed at 08/09/13 0800  Gross per 24 hour  Intake    380 ml  Output    495 ml  Net   -115 ml   Exam: General: No acute respiratory distress Lungs: Clear without wheezes or crackles Cardiovascular: Regular rate and rhythm without murmur gallop or rub normal S1 and S2 Abdomen: Nontender, nondistended, soft, bowel sounds positive, no  rebound, no ascites, no appreciable mass Extremities: No significant cyanosis, clubbing, or edema bilateral lower extremities  Data Reviewed: Basic Metabolic Panel:  Recent Labs Lab 08/03/13 1840 08/04/13 0415 08/06/13 0449 08/07/13 0400 08/09/13 0415  NA 123* 132* 134* 135* 134*  K 4.1 3.9 3.0* 3.5* 3.8  CL 85* 100 96 100 99  CO2 18* 19 27 25 24   GLUCOSE 122* 131* 128* 103* 108*  BUN 29* 29* 24* 22 17  CREATININE 0.98 1.17 0.90 0.81 0.78  CALCIUM 7.8* 6.5* 7.2* 6.9* 6.9*  MG 2.1  --   --  2.2  --   PHOS 3.7  --   --   --   --    Liver Function Tests:  Recent Labs Lab 08/03/13 1840 08/05/13 1436 08/06/13 0449  AST 39*  --  81*  ALT 15  --  31  ALKPHOS 68  --  78  BILITOT 2.0*  --  1.1  PROT 5.6* 5.0* 4.8*  ALBUMIN 1.8*  --  1.3*   CBC:  Recent Labs Lab 08/05/13 0447 08/06/13 0449 08/07/13 0400 08/08/13 0240 08/09/13 0415  WBC 29.6* 21.2* 27.2* 31.5* 22.6*  NEUTROABS  --  19.3*  --  28.0*  --   HGB 11.2* 10.2* 11.5* 10.9* 10.3*  HCT 32.4* 29.1* 32.9* 31.3* 30.0*  MCV 86.2 84.3 84.1 84.8 86.0  PLT 142* 128* 144* 160 201    Recent Results (from the past 240 hour(s))  MRSA PCR SCREENING     Status: None  Collection Time    08/03/13  5:07 PM      Result Value Range Status   MRSA by PCR NEGATIVE  NEGATIVE Final   Comment:            The GeneXpert MRSA Assay (FDA     approved for NASAL specimens     only), is one component of a     comprehensive MRSA colonization     surveillance program. It is not     intended to diagnose MRSA     infection nor to guide or     monitor treatment for     MRSA infections.  CULTURE, BLOOD (ROUTINE X 2)     Status: None   Collection Time    08/03/13  6:40 PM      Result Value Range Status   Specimen Description BLOOD RIGHT HAND   Final   Special Requests BOTTLES DRAWN AEROBIC ONLY 4CC PT ON [facility]   Final   Culture  Setup Time     Final   Value: 08/03/2013 21:12     Performed at [facility]   Culture      Final   Value: NO GROWTH 5 DAYS     Performed at [facility]   Report Status 08/09/2013 FINAL   Final  CULTURE, BLOOD (ROUTINE X 2)     Status: None   Collection Time    08/03/13  6:46 PM      Result Value Range Status   Specimen Description BLOOD LEFT HAND   Final   Special Requests BOTTLES DRAWN AEROBIC ONLY 1.5CC PT ON ZOSYN VANCO   Final   Culture  Setup Time     Final   Value: 08/03/2013 21:11     Performed at [facility]   Culture     Final   Value: NO GROWTH 5 DAYS     Performed at [facility]   Report Status 08/09/2013 FINAL   Final  ANAEROBIC CULTURE     Status: None   Collection Time    08/04/13  9:52 AM      Result Value Range Status   Specimen Description ABSCESS CHEST LEFT   Final   Special Requests LEFT CHEST WALL   Final   Gram Stain     Final   Value: ABUNDANT WBC PRESENT,BOTH PMN AND MONONUCLEAR     ABUNDANT GRAM POSITIVE COCCI     IN PAIRS IN CHAINS Performed at [facility] Gram Stain Report Called to,Read Back By and Verified With: Gram Stain Report Called to,Read Back By and Verified With: OR ON 08/04/13 AT 1130 BY K SCHULTZ     Performed at [facility]   Culture     Final   Value: NO ANAEROBES ISOLATED     Performed at [facility]   Report Status 08/09/2013 FINAL   Final  CULTURE, ROUTINE-ABSCESS     Status: None   Collection Time    08/04/13  9:52 AM      Result Value Range Status   Specimen Description ABSCESS CHEST LEFT   Final   Special Requests LEFT CHEST WALL   Final   Gram Stain     Final   Value: RARE WBC PRESENT, PREDOMINANTLY PMN     FEW GRAM POSITIVE COCCI IN PAIRS     FEW GRAM POSITIVE COCCI IN CLUSTERS     Performed at [facility]  Final   Value: MODERATE GROUP A STREP (S.PYOGENES) ISOLATED     [facility]   Report Status 08/07/2013 FINAL   Final  GRAM STAIN     Status: None   Collection Time    08/04/13  9:52 AM      Result Value  Range Status   Specimen Description ABSCESS CHEST LEFT   Final   Special Requests LEFT CHEST WALL   Final   Gram Stain     Final   Value: ABUNDANT WBC PRESENT,BOTH PMN AND MONONUCLEAR     ABUNDANT GRAM POSITIVE COCCI IN PAIRS IN CHAINS     CALLED TO OR 08/04/13 1130 BY K SCHULTZ   Report Status 08/04/2013 FINAL   Final  ANAEROBIC CULTURE     Status: None   Collection Time    08/04/13 10:07 AM      Result Value Range Status   Specimen Description TISSUE CHEST LEFT   Final   Special Requests TISSUE FROM LEFT CHEST WALL ABSCESS   Final   Gram Stain     Final   Value: RARE WBC PRESENT, PREDOMINANTLY MONONUCLEAR     RARE GRAM POSITIVE COCCI     IN PAIRS IN CHAINS [facility] DR [name] ON 08/04/13 AT 1158 BY K SCHULTZ     [facility]   Culture     Final   Value: NO ANAEROBES ISOLATED     [facility]formed at [facility]   Report Status 08/09/2013 FINAL   Final  TISSUE CULTURE     Status: None   Collection Time    08/04/13 10:07 AM      Result Value Range Status   Specimen Description TISSUE CHEST LEFT   Final   Special Requests TISSUE FROM LEFT CHEST WALL ABSCESS   Final   Gram Stain     Final   Value: [facility] RARE WBC PRESENT,BOTH PMN AND MONONUCLEAR     RARE GRAM POSITIVE COCCI     IN PAIRS IN CHAINS Gram Stain Report Called to,Read Back By and Verified With: Gram Stain Report Called to,Read Back By and Verified With:  DR [name] ON 08/04/13 BY K SCHULTZ CORRECTED RESULTS CALLED TO: JAMES D ON 08/05/13 AT 0830 BY SMIAS     [facility]formed at [facility]   Culture     Final   Value: FEW GROUP A STREP (S.PYOGENES) ISOLATED     [facility]formed at [facility]   Report Status 08/07/2013 FINAL   Final  GRAM STAIN     Status: None   Collection Time    08/04/13 10:07 AM      Result Value Range Status   Specimen Description TISSUE CHEST LEFT   Final   Special Requests TISSUE FROM LEFT CHEST WALL ABSCESS    Final   Gram Stain     Final   Value: RARE WBC PRESENT, PREDOMINANTLY MONONUCLEAR     RARE GRAM POSITIVE COCCI IN PAIRS IN CHAINS     CALLED TO DR [name] 08/04/13 1158 BY K SCHULTZ   Report Status 08/04/2013 FINAL   Final  GRAM STAIN     Status: None   Collection Time    08/05/13  1:50 PM      Result Value Range Status   Specimen Description PLEURAL   Final   Special Requests NONE   Final   Gram Stain  Final   Value: CYTOSPIN SLIDE     WBC PRESENT, PREDOMINANTLY PMN     GRAM POSITIVE COCCI IN PAIRS     CALLED TO DUNLAP,J RN 08/05/13 1754 WOOTEN,K   Report Status 08/05/2013 FINAL   Final  AFB CULTURE WITH SMEAR     Status: None   Collection Time    08/05/13  1:50 PM      Result Value Range Status   Specimen Description PLEURAL FLUID   Final   Special Requests NONE   Final   ACID FAST SMEAR     Final   Value: NO ACID FAST BACILLI SEEN     Performed at [facility]   Culture     Final   Value: CULTURE WILL BE EXAMINED FOR 6 WEEKS BEFORE ISSUING A FINAL REPORT     Performed at [facility]   Report Status PENDING   Incomplete  BODY FLUID CULTURE     Status: None   Collection Time    08/05/13  1:50 PM      Result Value Range Status   Specimen Description PLEURAL FLUID   Final   Special Requests NONE   Final   Gram Stain     Final   Value: WBC PRESENT, PREDOMINANTLY PMN     GRAM POSITIVE COCCI IN PAIRS     Gram Stain Report Called to,Read Back By and Verified With: Gram Stain Report Called to,Read Back By and Verified With: [employer] J RN 08/05/13 1754 WOOTEN K Performed at [facility] CYTOSPIN     Performed at [facility]   Culture     Final   Value: NO GROWTH 3 DAYS     Performed at [facility]   Report Status 08/09/2013 FINAL   Final  FUNGUS CULTURE W SMEAR     Status: None   Collection Time    08/05/13  1:50 PM      Result Value Range Status   Specimen Description PLEURAL FLUID   Final   Special Requests NONE   Final    Fungal Smear     Final   Value: NO YEAST OR FUNGAL ELEMENTS SEEN     Performed at [facility]   Culture     Final   Value: CULTURE IN PROGRESS FOR FOUR WEEKS     Performed at [facility]   Report Status PENDING   Incomplete  AFB CULTURE WITH SMEAR     Status: None   Collection Time    08/06/13 11:10 AM      Result Value Range Status   Specimen Description FLUID LEFT PLEURAL   Final   Special Requests NONE   Final   ACID FAST SMEAR     Final   Value: NO ACID FAST BACILLI SEEN     Performed at [facility]   Culture     Final   Value: CULTURE WILL BE EXAMINED FOR 6 WEEKS BEFORE ISSUING A FINAL REPORT     Performed at [facility]   Report Status PENDING   Incomplete  BODY FLUID CULTURE     Status: None   Collection Time    08/06/13 11:10 AM      Result Value Range Status   Specimen Description FLUID LEFT PLEURAL   Final   Special Requests NONE   Final   Gram Stain     Final   Value: WBC PRESENT, PREDOMINANTLY PMN  NO ORGANISMS SEEN     Performed at [facility]   Culture     Final   Value: NO GROWTH 3 DAYS     Performed at [facility]   Report Status PENDING   Incomplete     Studies:  Recent x-ray studies have been reviewed in detail by the Attending Physician  Scheduled Meds:  Scheduled Meds: . ampicillin-sulbactam (UNASYN) IV  3 g Intravenous Q6H  . clindamycin (CLEOCIN) IV  600 mg Intravenous Q6H  . influenza vac split quadrivalent PF  0.5 mL Intramuscular Tomorrow-1000    Time spent on care of this patient: 35 mins   [facility] T  [facility] Office  [phone] Pager - Text Page per Loretha Stapler as per below:  On-Call/Text Page:      [email or web address]      password [email or web address]  If 7PM-7AM, please contact night-coverage [email or web address] Password [email or web address] 08/09/2013, 4:12 PM   LOS: 6 days

## 2013-08-09 NOTE — Clinical Social Work Note (Signed)
Per RNCM, [facility] CSW has given patient letter. CSW signing off.  Roddie McBryant Lameisha Schuenemann, [place], [place], [phone]

## 2013-08-09 NOTE — Progress Notes (Signed)
[facility]    Date of Admission:  08/03/2013   Total days of antibiotics 7        Day 7clinda        Day 4 amp/sub           ID: [name] [name] is a 23 y.o. 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 Still is some discomfort.  Leukocytosis improving  Medications:  . ampicillin-sulbactam (UNASYN) IV  3 g Intravenous Q6H  . clindamycin (CLEOCIN) IV  600 mg Intravenous Q6H  . feeding supplement (ENSURE COMPLETE)  237 mL Oral TID BM  . influenza vac split quadrivalent PF  0.5 mL Intramuscular Tomorrow-1000    Objective: Vital signs in last 24 hours: Temp:  [98.3 F (36.8 C)-101.3 F (38.5 C)] 101.3 F (38.5 C) (01/21 1200) Pulse Rate:  [84-100] 100 (01/21 0700) Resp:  [27-41] 41 (01/21 0700) BP: (121-132)/(67-75) 128/67 mmHg (01/21 0700) SpO2:  [94 %-97 %] 97 % (01/21 0700) Weight:  [194 lb 10.7 oz (88.3 kg)] 194 lb 10.7 oz (88.3 kg) (01/21 0500) Physical Exam  Constitutional: oriented to person, place, and time. He appears well-developed and well-nourished. No distress.  HENT:  Mouth/Throat: Oropharynx is clear and moist. No oropharyngeal exudate.  Cardiovascular: tachycardic and normal heart sounds. Exam reveals no gallop and no friction rub.  No murmur heard.  Pulmonary/Chest: Effort normal and breath sounds normal. No respiratory distress. He has no wheezes. Left chest tube and ant chest wall bandaged. Chest tube output serous appearing Abdominal: Soft. Bowel sounds are normal. He exhibits no distension. There is no  tenderness.  Lymphadenopathy:  no cervical adenopathy.  Gu= scrotal edema Neurological: alert and oriented to person, place, and time.  Skin: Skin is warm and dry. No rash noted. No erythema.  Psychiatric:  a normal mood and affect.  behavior is normal.    Lab Results  Recent Labs  08/07/13 0400 08/08/13 0240 08/09/13 0415  WBC 27.2* 31.5* 22.6*  HGB 11.5* 10.9* 10.3*  HCT 32.9* 31.3* 30.0*  NA 135*  --  134*  K 3.5*  --  3.8  CL 100  --  99  CO2 25  --  24  BUN 22  --  17  CREATININE 0.81  --  0.78   Liver Panel No results found for this basename: PROT, ALBUMIN, AST, ALT, ALKPHOS, BILITOT, BILIDIR, IBILI,  in the last 72 hours  Microbiology: 1/18 left pleural fluid NGTD 1/17 pleural fluid = gpc in prs. No growth 1/16 abscess = group a strep 1/15 blood cx = NGTD  Studies/Results: Dg Chest Port 1 View  08/09/2013   CLINICAL DATA:  Left-sided empyema  EXAM: PORTABLE CHEST - 1 VIEW  COMPARISON:  Portable chest x-ray of August 08, 2013  FINDINGS: There remains increased density in the left mid and lower hemi thorax. The left hemidiaphragm remains obscured. Two large caliber chest tubes are present and unchanged in position. At additional tubular structure projects over the mid and lower thorax which is unchanged in appearance. The cardiopericardial silhouette  is enlarged though stable. The pulmonary vascularity is mildly prominent. On the right the interstitial markings have improved somewhat. There is no alveolar edema but there is a small right pleural effusion not greatly changed from the previous study.  IMPRESSION: 1. There is persistent increased density on the left consistent with the known empyema. There is no pneumothorax. 2 chest tubes and possibly a third drainage catheter remain on the left and are unchanged in appearance. 2. The appearance of the right mid and lower hemi thorax has improved with some resolution of interstitial edema. A small right pleural effusion  persists.   Electronically Signed   By: David  [place]   On: 08/09/2013 07:41   Dg Chest Port 1 View  08/08/2013   CLINICAL DATA:  Empyema.  EXAM: PORTABLE CHEST - 1 VIEW  COMPARISON:  08/07/2013  FINDINGS: No change in 3 drainage catheters over the left lower thorax. Lungs are hypoinflated with continued opacification of the left lung and right mid to lower lung without significant change. Remainder of the exam is unchanged.  IMPRESSION: Continued opacification over the left lung and right mid to lower lung without significant change. Three drainage catheters unchanged over the left lower thorax.   Electronically Signed   By: Elberta Fortis M.D.   On: 08/08/2013 07:39     Assessment/Plan: Group A strep nec fasc/sepsis = continue with clindamycin and amp/sub. Will discontinue clindamycin at the end of the day  Leukocytosis = likely stress response , improved today. Will continue to monitor.  Fever= recommend panculture with repeat blood cx, ua, and urine culture to see if we are dealing with secondary infection  Drue Second [facility] for Infect[facility] Diseases Cell: [phone] Pager: [phone]  08/09/2013, 1:38 PM

## 2013-08-09 NOTE — Progress Notes (Signed)
INITIAL NUTRITION ASSESSMENT  DOCUMENTATION CODES Per approved criteria  -Severe malnutrition in the context of acute illness/injury   INTERVENTION:  D/C Ensure Complete supplements  Magic Cup 3 times daily with meals (290 kcals, 9 gm protein per 4 oz cup) RD to follow for nutrition care plan  NUTRITION DIAGNOSIS: Increased nutrient needs related to chest wall abscess with wound VAC as evidenced by estimated needs, ongoing  Goal: Intake to meet >90% of estimated nutrition needs, progressing  Monitor:  PO & supplemental intake, weight, labs, I/O's  ASSESSMENT: 23 yo male with 1 week hx L shoulder/chest pain, fevers. Initially thought to be ?flu given fever and myalgia but swab was neg as outpt. Ultimately sought care at orthopedic for ?pulled shoulder muscle. L shoulder swelling and pain rapidly progressive. CT chest showed large chest wall abscess with ?gas and pt was direct admit to ICU. Pt is healthy, active. Denies trauma, obvious skin break, recent sick contacts. Tattoos are old, denies IV drug use, tobacco use.   Patient s/p procedures 1/16: CHEST WALL ABCESS DEBRIDEMENT  PLACEMENT OF WOUND VAC DRAINAGE SYSTEM  Patient s/p procedure 1/18: VATS FOR DRAINAGE OF EMPYEMA  Patient transferred from 32M-Medical ICU to 3S-Stepdown 1/19.  Patient's diet advanced to Regular 1/19 0802, then back to Clear Liquids 1/19 1634.  Re-advanced to Regular this AM.  Patient working on eating lunch upon RD visit.  Mom at bedside.  Doesn't like Ensure Complete supplements.  Loves ice cream -- RD to add Magic Cup (dessert supplements) to meal trays.  Height: Ht Readings from Last 1 Encounters:  08/04/13 5\' 10"  (1.778 m)    Weight: Wt Readings from Last 1 Encounters:  08/09/13 194 lb 10.7 oz (88.3 kg)  08/03/13 155 lb (70.6 kg) 08/06/13 179 lb (81.6 kg) 08/08/13 195 lb (88.5 kg)  Estimated Nutritional Needs: Kcal: 2300-2500 Protein: 120-140 gm Fluid: 2.3-2.5 L  Skin: wound VAC to  surgical chest incision  Diet Order: General   Intake/Output Summary (Last 24 hours) at 08/09/13 1420 Last data filed at 08/09/13 0800  Gross per 24 hour  Intake    380 ml  Output    495 ml  Net   -115 ml    Labs:   Recent Labs Lab 08/03/13 1840  08/06/13 0449 08/07/13 0400 08/09/13 0415  NA 123*  < > 134* 135* 134*  K 4.1  < > 3.0* 3.5* 3.8  CL 85*  < > 96 100 99  CO2 18*  < > 27 25 24   BUN 29*  < > 24* 22 17  CREATININE 0.98  < > 0.90 0.81 0.78  CALCIUM 7.8*  < > 7.2* 6.9* 6.9*  MG 2.1  --   --  2.2  --   PHOS 3.7  --   --   --   --   GLUCOSE 122*  < > 128* 103* 108*  < > = values in this interval not displayed.   Scheduled Meds: . ampicillin-sulbactam (UNASYN) IV  3 g Intravenous Q6H  . clindamycin (CLEOCIN) IV  600 mg Intravenous Q6H  . influenza vac split quadrivalent PF  0.5 mL Intramuscular Tomorrow-1000    Continuous Infusions: . sodium chloride 20 mL/hr at 08/05/13 1215    Past Medical History  Diagnosis Date  . Medical history non-contributory     Past Surgical History  Procedure Laterality Date  . No past surgeries    . Sternal wound debridement N/A 08/04/2013    Procedure: CHEST WALL ABCESS DEBRIDEMENT;  Surgeon: Kerin Perna, MD;  Location[facility] OR;  Service: Thoracic;  Laterality: N/A;  . Application of wound vac Left 08/04/2013    Procedure: 2 Ply APPLICATION OF WOUND VAC;  Surgeon: Kerin Perna, MD;  Location: [facility] OR;  Service: Thoracic;  Laterality: Left;  . Video assisted thoracoscopy (vats)/empyema Left 08/06/2013    Procedure: VIDEO ASSISTED THORACOSCOPY (VATS)/EMPYEMA;  Surgeon: Purcell Nails, MD;  Location: [facility] OR;  Service: Thoracic;  Laterality: Left;    Maureen Chatters, RD, LDN Pager #: [phone] After-Hours Pager #: [phone]

## 2013-08-10 ENCOUNTER — Inpatient Hospital Stay (HOSPITAL_COMMUNITY): Payer: Managed Care, Other (non HMO)

## 2013-08-10 LAB — CREATININE, SERUM
CREATININE: 0.93 mg/dL (ref 0.50–1.35)
GFR calc Af Amer: >90 mL/min (ref >90)

## 2013-08-10 LAB — URINE CULTURE
CULTURE: NO GROWTH
Colony Count: NO GROWTH

## 2013-08-10 LAB — CBC WITH DIFFERENTIAL/PLATELET
BASOS PCT: 0 % (ref 0–1)
Basophils Absolute: 0 10*3/uL (ref 0.0–0.1)
EOS ABS: 0 10*3/uL (ref 0.0–0.7)
Eosinophils Relative: 0 % (ref 0–5)
HCT: 29.6 % — ABNORMAL LOW (ref 39.0–52.0)
Hemoglobin: 10.2 g/dL — ABNORMAL LOW (ref 13.0–17.0)
LYMPHS ABS: 1.6 10*3/uL (ref 0.7–4.0)
Lymphocytes Relative: 8 % — ABNORMAL LOW (ref 12–46)
MCH: 29.6 pg (ref 26.0–34.0)
MCHC: 34.5 g/dL (ref 30.0–36.0)
MCV: 85.8 fL (ref 78.0–100.0)
MONO ABS: 0.8 10*3/uL (ref 0.1–1.0)
Monocytes Relative: 4 % (ref 3–12)
NEUTROS PCT: 88 % — AB (ref 43–77)
Neutro Abs: 18.1 10*3/uL — ABNORMAL HIGH (ref 1.7–7.7)
PLATELETS: 227 10*3/uL (ref 150–400)
RBC: 3.45 MIL/uL — AB (ref 4.22–5.81)
RDW: 13.6 % (ref 11.5–15.5)
WBC: 20.5 10*3/uL — ABNORMAL HIGH (ref 4.0–10.5)

## 2013-08-10 LAB — BODY FLUID CULTURE: CULTURE: NO GROWTH

## 2013-08-10 MED ORDER — MAGNESIUM HYDROXIDE 400 MG/5ML PO SUSP: 15.0000 mL | Freq: Every day | ORAL | Status: DC | PRN

## 2013-08-10 MED ORDER — DOCUSATE SODIUM 100 MG PO CAPS
200.0000 mg | ORAL_CAPSULE | Freq: Every day | ORAL | Status: DC
  Administered 2013-08-10: 200 mg via ORAL
  Filled 2013-08-10: qty 2

## 2013-08-10 NOTE — Progress Notes (Signed)
[facility] TEAM 1 - Stepdown/ICU TEAM Progress Note  Xachary Hambly ZOX:[id number] DOB: 1990-08-04 DOA: 08/03/2013 PCP: Juliette Alcide, MD  Admit HPI / Brief Narrative: 23 yo male without medical history admitted 1/15 with large L chest wall abscess with gaseous formation concerning for necrotizing fascitis.   SIGNIFICANT EVENTS / STUDIES:  1/15 CT abd/pelvis > 6.6 x 2.7cm  L anterior chest wall abscess with marked edema throughout, gas formation and ?nec fasc  1/17 Thoracentesis > 1000 mL clear yellow fluid LEFT drained  1/18 VATS > 1200 cc empyema drained, 3 chest tubes placed  1/18 ECHO > EF 60-65%, small pericardial effusion  HPI/Subjective: Patient laying comfortably in bed without complaints of pain.  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 rate 21, height 5\' 10"  (1.778 m), weight 80.3 kg (177 lb 0.5 oz), SpO2 96.00%.  Intake/Output Summary (Last 24 hours) at 08/10/13 1643 Last data filed at 08/10/13 1600  Gross per 24 hour  Intake   2360 ml  Output     50 ml  Net   2310 ml   Exam: General: No acute respiratory distress Lungs: Clear without wheezes or crackles- left sided chest tube noted Cardiovascular: Regular rate and rhythm without murmur gallop or rub normal S1 and S2 Abdomen: Nontender, nondistended, soft, bowel sounds positive, no rebound, no ascites, no appreciable mass Extremities: No significant  cyanosis, clubbing, or edema bilateral lower extremities  Data Reviewed: Basic Metabolic Panel:  Recent Labs Lab 08/03/13 1840 08/04/13 0415 08/06/13 0449 08/07/13 0400 08/09/13 0415 08/10/13 0325  NA 123* 132* 134* 135* 134*  --   K 4.1 3.9 3.0* 3.5* 3.8  --   CL 85* 100 96 100 99  --   CO2 18* 19 27 25 24   --   GLUCOSE 122* 131* 128* 103* 108*  --   BUN 29* 29* 24* 22 17  --   CREATININE 0.98 1.17 0.90 0.81 0.78 0.93  CALCIUM 7.8* 6.5* 7.2* 6.9* 6.9*  --   MG 2.1  --   --  2.2  --   --   PHOS 3.7  --   --   --   --   --    Liver Function Tests:  Recent Labs Lab 08/03/13 1840 08/05/13 1436 08/06/13 0449  AST 39*  --  81*  ALT 15  --  31  ALKPHOS 68  --  78  BILITOT 2.0*  --  1.1  PROT 5.6* 5.0* 4.8*  ALBUMIN 1.8*  --  1.3*   CBC:  Recent Labs Lab 08/05/13 0447 08/06/13 0449 08/07/13 0400 08/08/13 0240 08/09/13 0415 08/10/13 1606  WBC 29.6* 21.2* 27.2* 31.5* 22.6* 20.5*  NEUTROABS  --  19.3*  --  28.0*  --  PENDING  HGB 11.2* 10.2* 11.5* 10.9* 10.3* 10.2*  HCT 32.4* 29.1* 32.9* 31.3* 30.0* 29.6*  MCV 86.2 84.3 84.1 84.8 86.0 85.8  PLT 142* 128*  144* 160 201 227    Recent Results (from the past 240 hour(s))  MRSA PCR SCREENING     Status: None   Collection Time    08/03/13  5:07 PM      Result Value Range Status   MRSA by PCR NEGATIVE  NEGATIVE Final   Comment:            The GeneXpert MRSA Assay (FDA     approved for NASAL specimens     only), is one component of a     comprehensive MRSA colonization     surveillance program. It is not     intended to diagnose MRSA     infection nor to guide or     monitor treatment for     MRSA infections.  CULTURE, BLOOD (ROUTINE X 2)     Status: None   Collection Time    08/03/13  6:40 PM      Result Value Range Status   Specimen Description BLOOD RIGHT HAND   Final   Special Requests BOTTLES DRAWN AEROBIC ONLY 4CC PT ON [facility]   Final   Culture  Setup Time     Final   Value: 08/03/2013 21:12      Performed at [facility]   Culture     Final   Value: NO GROWTH 5 DAYS     Performed at [facility]   Report Status 08/09/2013 FINAL   Final  CULTURE, BLOOD (ROUTINE X 2)     Status: None   Collection Time    08/03/13  6:46 PM      Result Value Range Status   Specimen Description BLOOD LEFT HAND   Final   Special Requests BOTTLES DRAWN AEROBIC ONLY 1.5CC PT ON [facility]OS[facility] VANCO   Final   Culture  Setup Time     Final   Value: 08/03/2013 21:11     Performed at [facility]   Culture     Final   Value: NO GROWTH 5 DAYS     Performed at [facility]   Report Status 08/09/2013 FINAL   Final  ANAEROBIC CULTURE     Status: None   Collection Time    08/04/13  9:52 AM      Result Value Range Status   Specimen Description ABSCESS CHEST LEFT   Final   Special Requests LEFT CHEST WALL   Final   Gram Stain     Final   Value: ABUNDANT WBC PRESENT,BOTH PMN AND MONONUCLEAR     ABUNDANT GRAM POSITIVE COCCI     IN PAIRS IN CHAINS Performed at [facility] Gram Stain Report Called to,Read Back By and Verified With: Gram Stain Report Called to,Read Back By and Verified With: OR ON 08/04/13 AT 1130 BY K SCHULTZ     Performed at [facility]   Culture     Final   Value: NO ANAEROBES ISOLATED     Performed at [facility]   Report Status 08/09/2013 FINAL   Final  CULTURE, ROUTINE-ABSCESS     Status: None   Collection Time    08/04/13  9:52 AM      Result Value Range Status   Specimen Description ABSCESS CHEST LEFT   Final   Special Requests LEFT CHEST WALL   Final   Gram Stain     Final   Value: RARE WBC PRESENT, PREDOMINANTLY PMN     FEW GRAM POSITIVE COCCI IN  PAIRS     FEW GRAM POSITIVE COCCI IN CLUSTERS     Performed at [facility]   Culture     Final   Value: MODERATE GROUP A STREP (S.PYOGENES) ISOLATED     Performed at [facility]   Report Status 08/07/2013 FINAL   Final  GRAM STAIN     Status: None   Collection Time     08/04/13  9:52 AM      Result Value Range Status   Specimen Description ABSCESS CHEST LEFT   Final   Special Requests LEFT CHEST WALL   Final   Gram Stain     Final   Value: ABUNDANT WBC PRESENT,BOTH PMN AND MONONUCLEAR     ABUNDANT GRAM POSITIVE COCCI IN PAIRS IN CHAINS     CALLED TO OR 08/04/13 1130 BY K SCHULTZ   Report Status 08/04/2013 FINAL   Final  ANAEROBIC CULTURE     Status: None   Collection Time    08/04/13 10:07 AM      Result Value Range Status   Specimen Description TISSUE CHEST LEFT   Final   Special Requests TISSUE FROM LEFT CHEST WALL ABSCESS   Final   Gram Stain     Final   Value: RARE WBC PRESENT, PREDOMINANTLY MONONUCLEAR     RARE GRAM POSITIVE COCCI     IN PAIRS IN CHAINS Performed at [facility] DR [name] ON 08/04/13 AT 1158 BY K SCHULTZ     Performed at [facility]   Culture     Final   Value: NO ANAEROBES ISOLATED     Performed at [facility]   Report Status 08/09/2013 FINAL   Final  TISSUE CULTURE     Status: None   Collection Time    08/04/13 10:07 AM      Result Value Range Status   Specimen Description TISSUE CHEST LEFT   Final   Special Requests TISSUE FROM LEFT CHEST WALL ABSCESS   Final   Gram Stain     Final   Value: Performed at [facility] RARE WBC PRESENT,BOTH PMN AND MONONUCLEAR     RARE GRAM POSITIVE COCCI     IN PAIRS IN CHAINS Gram Stain Report Called to,Read Back By and Verified With: Gram Stain Report Called to,Read Back By and Verified With:  DR [name] ON 08/04/13 BY K SCHULTZ CORRECTED RESULTS CALLED TO: JAMES D ON 08/05/13 AT 0830 BY SMIAS     Performed at [facility]   Culture     Final   Value: FEW GROUP A STREP (S.PYOGENES) ISOLATED     Performed at [facility]   Report Status 08/07/2013 FINAL   Final  GRAM STAIN     Status: None   Collection Time    08/04/13 10:07 AM      Result Value Range Status   Specimen Description TISSUE CHEST LEFT   Final   Special  Requests TISSUE FROM LEFT CHEST WALL ABSCESS   Final   Gram Stain     Final   Value: RARE WBC PRESENT, PREDOMINANTLY MONONUCLEAR     RARE GRAM POSITIVE COCCI IN PAIRS IN CHAINS     CALLED TO DR [name] 08/04/13 1158 BY K SCHULTZ   Report Status 08/04/2013 FINAL   Final  GRAM STAIN     Status: None   Collection Time    08/05/13  1:50 PM  Result Value Range Status   Specimen Description PLEURAL   Final   Special Requests NONE   Final   Gram Stain     Final   Value: CYTOSPIN SLIDE     WBC PRESENT, PREDOMINANTLY PMN     GRAM POSITIVE COCCI IN PAIRS     CALLED TO DUNLAP,J RN 08/05/13 1754 WOOTEN,K   Report Status 08/05/2013 FINAL   Final  AFB CULTURE WITH SMEAR     Status: None   Collection Time    08/05/13  1:50 PM      Result Value Range Status   Specimen Description PLEURAL FLUID   Final   Special Requests NONE   Final   ACID FAST SMEAR     Final   Value: NO ACID FAST BACILLI SEEN     [facility]   Culture     Final   Value: CULTURE WILL BE EXAMINED FOR 6 WEEKS BEFORE ISSUING A FINAL REPORT     [facility]   Report Status PENDING   Incomplete  BODY FLUID CULTURE     Status: None   Collection Time    08/05/13  1:50 PM      Result Value Range Status   Specimen Description PLEURAL FLUID   Final   Special Requests NONE   Final   Gram Stain     Final   Value: WBC PRESENT, PREDOMINANTLY PMN     GRAM POSITIVE COCCI IN PAIRS     Gram Stain Report Called to,Read Back By and Verified With: Gram Stain Report Called to,Read Back By and Verified With: Shirlee LimerickDUNLAP J RN 08/05/13 1754 WOOTEN K [facility] CYTOSPIN     [facility] at [facility]   Culture     Final   Value: NO GROWTH 3 DAYS     [facility] at [facility]   Report Status 08/09/2013 FINAL   Final  FUNGUS CULTURE W SMEAR     Status: None   Collection Time    08/05/13  1:50 PM      Result Value Range Status   Specimen Description PLEURAL FLUID    Final   Special Requests NONE   Final   Fungal Smear     Final   Value: NO YEAST OR FUNGAL ELEMENTS SEEN     [facility] at [facility]   Culture     Final   Value: CULTURE IN PROGRESS FOR FOUR WEEKS     [facility]formed at [facility]   Report Status PENDING   Incomplete  AFB CULTURE WITH SMEAR     Status: None   Collection Time    08/06/13 11:10 AM      Result Value Range Status   Specimen Description FLUID LEFT PLEURAL   Final   Special Requests NONE   Final   ACID FAST SMEAR     Final   Value: NO ACID FAST BACILLI SEEN     [facility]formed at [facility]   Culture     Final   Value: CULTURE WILL BE EXAMINED FOR 6 WEEKS BEFORE ISSUING A FINAL REPORT     [facility] at [facility]   Report Status PENDING   Incomplete  BODY FLUID CULTURE     Status: None   Collection Time    08/06/13 11:10 AM      Result Value Range Status   Specimen Description FLUID LEFT PLEURAL  Final   Special Requests NONE   Final   Gram Stain     Final   Value: WBC PRESENT, PREDOMINANTLY PMN     NO ORGANISMS SEEN     Performed at [facility]   Culture     Final   Value: NO GROWTH 3 DAYS     Performed at [facility]   Report Status 08/10/2013 FINAL   Final  CULTURE, BLOOD (ROUTINE X 2)     Status: None   Collection Time    08/09/13  1:45 PM      Result Value Range Status   Specimen Description BLOOD RIGHT ARM   Final   Special Requests BOTTLES DRAWN AEROBIC ONLY 5CC   Final   Culture  Setup Time     Final   Value: 08/09/2013 20:11     Performed at [facility]   Culture     Final   Value:        BLOOD CULTURE RECEIVED NO GROWTH TO DATE CULTURE WILL BE HELD FOR 5 DAYS BEFORE ISSUING A FINAL NEGATIVE REPORT     Performed at [facility]   Report Status PENDING   Incomplete  CULTURE, BLOOD (ROUTINE X 2)     Status: None   Collection Time    08/09/13  3:50 PM      Result Value Range Status   Specimen Description BLOOD RIGHT HAND   Final    Special Requests BOTTLES DRAWN AEROBIC ONLY 8CC   Final   Culture  Setup Time     Final   Value: 08/09/2013 20:11     Performed at [facility]   Culture     Final   Value:        BLOOD CULTURE RECEIVED NO GROWTH TO DATE CULTURE WILL BE HELD FOR 5 DAYS BEFORE ISSUING A FINAL NEGATIVE REPORT     Performed at [facility]   Report Status PENDING   Incomplete     Studies:  Recent x-ray studies have been reviewed in detail by the Attending Physician  Scheduled Meds:  Scheduled Meds: . ampicillin-sulbactam (UNASYN) IV  3 g Intravenous Q6H  . docusate sodium  200 mg Oral Daily  . influenza vac split quadrivalent PF  0.5 mL Intramuscular Tomorrow-1000    Time spent on care of this patient: 35 mins   Calvert Cantor, MD  [facility] Office  [phone] Pager - Text Page per Loretha Stapler as per below:  On-Call/Text Page:      [email or web address]      password [email or web address]  If 7PM-7AM, please contact night-coverage [email or web address] Password [facility] 08/10/2013, 4:43 PM   LOS: 7 days

## 2013-08-10 NOTE — Care Management Note (Unsigned)
    Page 1 of 1   08/10/2013     3:37:37 PM   CARE MANAGEMENT NOTE 08/10/2013  Patient:  [name] [name]   Account Number:  [id number]  Date Initiated:  08/04/2013  Documentation initiated by:  Avie ArenasBROWN,SARAH  Subjective/Objective Assessment:   Admitted with chest abscess - septic - plan for surgery 1-16     Action/Plan:   Anticipated DC Date:  08/11/2013   Anticipated DC Plan:  HOME W HOME HEALTH SERVICES      DC Planning Services  CM consult      Choice offered to / List presented to:             Status of service:  In process, will continue to follow Medicare Important Message given?   (If response is "NO", the following Medicare IM given date fields will be blank) Date Medicare IM given:   Date Additional Medicare IM given:    Discharge Disposition:    Per UR Regulation:  Reviewed for med. necessity/level of care/duration of stay  If discussed at Long Length of Stay Meetings, dates discussed:   08/11/2013    Comments:  Contact:  [place] Mother [phone]  08-10-13 Chest wall abscess-  Days Post-Op Procedure(s) VATS- Chest tube: Plan for home when medically stable. Tomi BambergerBrenda Graves- Bigelow, RN, BSN [phone]   08-07-13 3pm Avie ArenasSarah Brown, RNBSN - [phone] Wound VAC replaced - plan for closure of wound prior to dc. CM will continue to follow. Per Rosann Auerbachigna - H/Infusion and DME thru [facility] ph# [phone] fax [phone]

## 2013-08-10 NOTE — Progress Notes (Signed)
ANTIBIOTIC CONSULT NOTE - FOLLOW UP  Pharmacy Consult for Unasyn/Clinda Indication: Chest wall abscess  No Known Allergies  Patient Measurements: Height: 5\' 10"  (177.8 cm) Weight: 177 lb 0.5 oz (80.3 kg) IBW/kg (Calculated) : 73 Adjusted Body Weight:   Vital Signs: Temp: 98.8 F (37.1 C) (01/22 0730) Temp src: Oral (01/22 0730) BP: 123/71 mmHg (01/22 0730) Pulse Rate: 96 (01/22 0400) Intake/Output from previous day: 01/21 0701 - 01/22 0700 In: 2450 [P.O.:1920; I.V.:230; IV Piggyback:300] Out: 325 [Drains:25; Chest Tube:300] Intake/Output from this shift:    Labs:  Recent Labs  08/08/13 0240 08/09/13 0415 08/10/13 0325  WBC 31.5* 22.6*  --   HGB 10.9* 10.3*  --   PLT 160 201  --   CREATININE  --  0.78 0.93   Estimated Creatinine Clearance: 130.8 ml/min (by C-G formula based on Cr of 0.93). No results found for this basename: VANCOTROUGH, Leodis Binet, VANCORANDOM, GENTTROUGH, GENTPEAK, GENTRANDOM, TOBRATROUGH, TOBRAPEAK, TOBRARND, AMIKACINPEAK, AMIKACINTROU, AMIKACIN,  in the last 72 hours   Microbiology: Recent Results (from the past 720 hour(s))  MRSA PCR SCREENING     Status: None   Collection Time    08/03/13  5:07 PM      Result Value Range Status   MRSA by PCR NEGATIVE  NEGATIVE Final   Comment:            The GeneXpert MRSA Assay (FDA     approved for NASAL specimens     only), is one component of a     comprehensive MRSA colonization     surveillance program. It is not     intended to diagnose MRSA     infection nor to guide or     monitor treatment for     MRSA infections.  CULTURE, BLOOD (ROUTINE X 2)     Status: None   Collection Time    08/03/13  6:40 PM      Result Value Range Status   Specimen Description BLOOD RIGHT HAND   Final   Special Requests BOTTLES DRAWN AEROBIC ONLY 4CC PT ON [facility]   Final   Culture  Setup Time     Final   Value: 08/03/2013 21:12     Performed at [facility]   Culture     Final   Value: NO GROWTH 5  DAYS     Performed at [facility]   Report Status 08/09/2013 FINAL   Final  CULTURE, BLOOD (ROUTINE X 2)     Status: None   Collection Time    08/03/13  6:46 PM      Result Value Range Status   Specimen Description BLOOD LEFT HAND   Final   Special Requests BOTTLES DRAWN AEROBIC ONLY 1.5CC PT ON ZOSYN VANCO   Final   Culture  Setup Time     Final   Value: 08/03/2013 21:11     Performed at [facility]   Culture     Final   Value: NO GROWTH 5 DAYS     Performed at [facility]   Report Status 08/09/2013 FINAL   Final  ANAEROBIC CULTURE     Status: None   Collection Time    08/04/13  9:52 AM      Result Value Range Status   Specimen Description ABSCESS CHEST LEFT   Final   Special Requests LEFT CHEST WALL   Final   Gram Stain     Final   Value: ABUNDANT WBC PRESENT,BOTH  PMN AND MONONUCLEAR     ABUNDANT GRAM POSITIVE COCCI     IN PAIRS IN CHAINS Performed at [facility] Gram Stain Report Called to,Read Back By and Verified With: Gram Stain Report Called to,Read Back By and Verified With: OR ON 08/04/13 AT 1130 BY K SCHULTZ     Performed at [facility]   Culture     Final   Value: NO ANAEROBES ISOLATED     Performed at [facility]   Report Status 08/09/2013 FINAL   Final  CULTURE, ROUTINE-ABSCESS     Status: None   Collection Time    08/04/13  9:52 AM      Result Value Range Status   Specimen Description ABSCESS CHEST LEFT   Final   Special Requests LEFT CHEST WALL   Final   Gram Stain     Final   Value: RARE WBC PRESENT, PREDOMINANTLY PMN     FEW GRAM POSITIVE COCCI IN PAIRS     FEW GRAM POSITIVE COCCI IN CLUSTERS     Performed at [facility]   Culture     Final   Value: MODERATE GROUP A STREP (S.PYOGENES) ISOLATED     Performed at [facility]   Report Status 08/07/2013 FINAL   Final  GRAM STAIN     Status: None   Collection Time    08/04/13  9:52 AM      Result Value Range Status   Specimen  Description ABSCESS CHEST LEFT   Final   Special Requests LEFT CHEST WALL   Final   Gram Stain     Final   Value: ABUNDANT WBC PRESENT,BOTH PMN AND MONONUCLEAR     ABUNDANT GRAM POSITIVE COCCI IN PAIRS IN CHAINS     CALLED TO OR 08/04/13 1130 BY K SCHULTZ   Report Status 08/04/2013 FINAL   Final  ANAEROBIC CULTURE     Status: None   Collection Time    08/04/13 10:07 AM      Result Value Range Status   Specimen Description TISSUE CHEST LEFT   Final   Special Requests TISSUE FROM LEFT CHEST WALL ABSCESS   Final   Gram Stain     Final   Value: RARE WBC PRESENT, PREDOMINANTLY MONONUCLEAR     RARE GRAM POSITIVE COCCI     IN PAIRS IN CHAINS Performed at [facility] DR [name] ON 08/04/13 AT 1158 BY K SCHULTZ     Performed at [facility]   Culture     Final   Value: NO ANAEROBES ISOLATED     Performed at [facility]   Report Status 08/09/2013 FINAL   Final  TISSUE CULTURE     Status: None   Collection Time    08/04/13 10:07 AM      Result Value Range Status   Specimen Description TISSUE CHEST LEFT   Final   Special Requests TISSUE FROM LEFT CHEST WALL ABSCESS   Final   Gram Stain     Final   Value: Performed at [facility] RARE WBC PRESENT,BOTH PMN AND MONONUCLEAR     RARE GRAM POSITIVE COCCI     IN PAIRS IN CHAINS Gram Stain Report Called to,Read Back By and Verified With: Gram Stain Report Called to,Read Back By and Verified With:  DR [name] ON 08/04/13 BY K SCHULTZ CORRECTED RESULTS CALLED TO: JAMES D ON 08/05/13 AT 0830 BY [facility]  Performed at [facility]   Culture     Final   Value: FEW GROUP A STREP (S.PYOGENES) ISOLATED     [facility] at [facility]   Report Status 08/07/2013 FINAL   Final  GRAM STAIN     Status: None   Collection Time    08/04/13 10:07 AM      Result Value Range Status   Specimen Description TISSUE CHEST LEFT   Final   Special Requests TISSUE FROM LEFT CHEST WALL ABSCESS   Final   Gram Stain      Final   Value: RARE WBC PRESENT, PREDOMINANTLY MONONUCLEAR     RARE GRAM POSITIVE COCCI IN PAIRS IN CHAINS     CALLED TO DR [name] 08/04/13 1158 BY K SCHULTZ   Report Status 08/04/2013 FINAL   Final  GRAM STAIN     Status: None   Collection Time    08/05/13  1:50 PM      Result Value Range Status   Specimen Description PLEURAL   Final   Special Requests NONE   Final   Gram Stain     Final   Value: CYTOSPIN SLIDE     WBC PRESENT, PREDOMINANTLY PMN     GRAM POSITIVE COCCI IN PAIRS     CALLED TO DUNLAP,J RN 08/05/13 1754 WOOTEN,K   Report Status 08/05/2013 FINAL   Final  AFB CULTURE WITH SMEAR     Status: None   Collection Time    08/05/13  1:50 PM      Result Value Range Status   Specimen Description PLEURAL FLUID   Final   Special Requests NONE   Final   ACID FAST SMEAR     Final   Value: NO ACID FAST BACILLI SEEN     Performed at [facility]   Culture     Final   Value: CULTURE WILL BE EXAMINED FOR 6 WEEKS BEFORE ISSUING A FINAL REPORT     Performed at [facility]   Report Status PENDING   Incomplete  BODY FLUID CULTURE     Status: None   Collection Time    08/05/13  1:50 PM      Result Value Range Status   Specimen Description PLEURAL FLUID   Final   Special Requests NONE   Final   Gram Stain     Final   Value: WBC PRESENT, PREDOMINANTLY PMN     GRAM POSITIVE COCCI IN PAIRS     Gram Stain Report Called to,Read Back By and Verified With: Gram Stain Report Called to,Read Back By and Verified With: Shirlee LimerickDUNLAP J RN 08/05/13 1754 WOOTEN K [facility] CYTOSPIN     [facility]formed at [facility]   Culture     Final   Value: NO GROWTH 3 DAYS     [facility]formed at [facility]   Report Status 08/09/2013 FINAL   Final  FUNGUS CULTURE W SMEAR     Status: None   Collection Time    08/05/13  1:50 PM      Result Value Range Status   Specimen Description PLEURAL FLUID   Final   Special Requests NONE   Final   Fungal Smear     Final    Value: NO YEAST OR FUNGAL ELEMENTS SEEN     [facility]formed at [facility]   Culture     Final   Value: CULTURE IN PROGRESS FOR FOUR WEEKS  Performed at [facility]   Report Status PENDING   Incomplete  AFB CULTURE WITH SMEAR     Status: None   Collection Time    08/06/13 11:10 AM      Result Value Range Status   Specimen Description FLUID LEFT PLEURAL   Final   Special Requests NONE   Final   ACID FAST SMEAR     Final   Value: NO ACID FAST BACILLI SEEN     Performed at [facility]   Culture     Final   Value: CULTURE WILL BE EXAMINED FOR 6 WEEKS BEFORE ISSUING A FINAL REPORT     Performed at [facility]   Report Status PENDING   Incomplete  BODY FLUID CULTURE     Status: None   Collection Time    08/06/13 11:10 AM      Result Value Range Status   Specimen Description FLUID LEFT PLEURAL   Final   Special Requests NONE   Final   Gram Stain     Final   Value: WBC PRESENT, PREDOMINANTLY PMN     NO ORGANISMS SEEN     Performed at [facility]   Culture     Final   Value: NO GROWTH 3 DAYS     Performed at [facility]   Report Status PENDING   Incomplete  CULTURE, BLOOD (ROUTINE X 2)     Status: None   Collection Time    08/09/13  1:45 PM      Result Value Range Status   Specimen Description BLOOD RIGHT ARM   Final   Special Requests BOTTLES DRAWN AEROBIC ONLY 5CC   Final   Culture  Setup Time     Final   Value: 08/09/2013 20:11     Performed at [facility]   Culture     Final   Value:        BLOOD CULTURE RECEIVED NO GROWTH TO DATE CULTURE WILL BE HELD FOR 5 DAYS BEFORE ISSUING A FINAL NEGATIVE REPORT     Performed at [facility]   Report Status PENDING   Incomplete  CULTURE, BLOOD (ROUTINE X 2)     Status: None   Collection Time    08/09/13  3:50 PM      Result Value Range Status   Specimen Description BLOOD RIGHT HAND   Final   Special Requests BOTTLES DRAWN AEROBIC ONLY 8CC   Final   Culture  Setup  Time     Final   Value: 08/09/2013 20:11     Performed at [facility]   Culture     Final   Value:        BLOOD CULTURE RECEIVED NO GROWTH TO DATE CULTURE WILL BE HELD FOR 5 DAYS BEFORE ISSUING A FINAL NEGATIVE REPORT     Performed at [facility]   Report Status PENDING   Incomplete    Anti-infectives   Start     Dose/Rate Route Frequency Ordered Stop   08/07/13 1445  vancomycin (VANCOCIN) powder 1,000 mg     1,000 mg Other To Surgery 08/07/13 1434 08/08/13 1445   08/06/13 1325  cefUROXime (ZINACEF) 1.5 g in dextrose 5 % 50 mL IVPB  Status:  Discontinued     1.5 g 100 mL/hr over 30 Minutes Intravenous 60 min pre-op 08/06/13 1326 08/06/13 1336   08/06/13 1200  Ampicillin-Sulbactam (UNASYN) 3 g in sodium chloride 0.9 %  100 mL IVPB     3 g 100 mL/hr over 60 Minutes Intravenous Every 6 hours 08/06/13 1103     08/05/13 1300  [MAR Hold]  imipenem-cilastatin (PRIMAXIN) 500 mg in sodium chloride 0.9 % 100 mL IVPB  Status:  Discontinued     (On MAR Hold since 08/06/13 0951)   500 mg 200 mL/hr over 30 Minutes Intravenous 4 times per day 08/05/13 1259 08/06/13 1103   08/04/13 1012  vancomycin (VANCOCIN) 1,000 mg in sodium chloride 0.9 % 1,000 mL irrigation  Status:  Discontinued       As needed 08/04/13 1012 08/04/13 1035   08/04/13 0300  [MAR Hold]  vancomycin (VANCOCIN) IVPB 1000 mg/200 mL premix  Status:  Discontinued     (On MAR Hold since 08/06/13 0951)   1,000 mg 200 mL/hr over 60 Minutes Intravenous Every 8 hours 08/03/13 1931 08/06/13 1103   08/03/13 1830  piperacillin-tazobactam (ZOSYN) IVPB 3.375 g  Status:  Discontinued     3.375 g 12.5 mL/hr over 240 Minutes Intravenous Every 8 hours 08/03/13 1654 08/05/13 1255   08/03/13 1800  vancomycin (VANCOCIN) IVPB 1000 mg/200 mL premix     1,000 mg 200 mL/hr over 60 Minutes Intravenous  Once 08/03/13 1654 08/03/13 1949   08/03/13 1800  clindamycin (CLEOCIN) IVPB 600 mg  Status:  Discontinued     600 mg 100 mL/hr over 30  Minutes Intravenous 4 times per day 08/03/13 1709 08/03/13 1716   08/03/13 1800  clindamycin (CLEOCIN) IVPB 600 mg     600 mg 100 mL/hr over 30 Minutes Intravenous 4 times per day 08/03/13 1717        Assessment: 23 y.o. male admitted 08/03/2013 with L chest wall abscess with gaseous formation.   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  1/21: BC x 2: pending 1/18 pleural fluid> pending 1/17 pleural fluid> GPC in pairs. Final 1/16 abscess> mod group A strep; no anaerobes 1/16 Tissue Cx> Group A strep (S. Pyogenes) 1/15 BCx> ngtd - but drawn after abx   1/15 Vanc> 1/17 VT = 19 - con't same. Morning dose given a little late, so trough may be a bit artificially high, but should be > 15; con't 1g q 8 > d/c 1/18 1/15 Zosyn>1/17 1/15 Clinda> 1/17 Primaxin > 1/18 1/18 Unasyn >   Cardiovascular: BP low-nl. HR 90s.   Endocrinology: CBGs ok  GI / Nutrition: Reg diet. LFTS wnl except ALT 81.   Neurology/MSK: tramadol/OxyIR/morphine prn.  Nephrology: Scr 0.78 (stable), lytes: ok  Pulmonary: 4L [place]; ALI pattern  Hematology / Oncology: CBC stable  PTA Medication Issues: no PTA meds  Best Practices: SCDs   Plan:  - continue clinda 600mg  IV q6 - continue unasyn 3g IV q6 Pharmacy will sign off. No further dosage adjustment required. Please reconsult for further dosing assistance.  Zorana Brockwell S. Merilynn Finland, PharmD, BCPS Clinical Staff Pharmacist Pager [phone]  Misty Stanley Stillinger 08/10/2013,10:23 AM

## 2013-08-10 NOTE — Progress Notes (Signed)
UR Completed Harleyquinn Gasser Graves-Bigelow, RN,BSN [phone]  

## 2013-08-10 NOTE — Progress Notes (Addendum)
       [address]       [place] [id number]             [phone]          4 Days Post-Op Procedure(s) (LRB): VIDEO ASSISTED THORACOSCOPY (VATS)/EMPYEMA (Left)  Subjective: Just back from x-ray.  Denies pain, breathing stable.   Objective: Vital signs in last 24 hours: Patient Vitals for the past 24 hrs:  BP Temp Temp src Pulse Resp SpO2 Weight  08/10/13 0730 123/71 mmHg 98.8 F (37.1 C) Oral - - 96 % -  08/10/13 0520 - 98.5 F (36.9 C) Oral - - - -  08/10/13 0400 134/65 mmHg 101.5 F (38.6 C) Oral 96 21 94 % 177 lb 0.5 oz (80.3 kg)  08/10/13 0003 127/70 mmHg 99.8 F (37.7 C) Oral 84 28 97 % -  08/09/13 2130 - 100.7 F (38.2 C) Axillary - - - -  08/09/13 2005 131/74 mmHg 103.1 F (39.5 C) Oral 106 37 99 % -  08/09/13 1450 129/67 mmHg 98.9 F (37.2 C) Oral 105 40 96 % -  08/09/13 1200 128/71 mmHg 101.3 F (38.5 C) Oral 90 30 95 % -   Current Weight  08/10/13 177 lb 0.5 oz (80.3 kg)     Intake/Output from previous day: 01/21 0701 - 01/22 0700 In: 1780 [P.O.:1440; I.V.:190; IV Piggyback:150] Out: 325 [Drains:25; Chest Tube:300]    PHYSICAL EXAM:  Heart: RRR Lungs: Diminished BS on L Wound: Dressed and dry, L wound VAC in place Chest tube: no air leak     Lab Results: CBC: Recent Labs  08/08/13 0240 08/09/13 0415  WBC 31.5* 22.6*  HGB 10.9* 10.3*  HCT 31.3* 30.0*  PLT 160 201   BMET:  Recent Labs  08/09/13 0415 08/10/13 0325  NA 134*  --   K 3.8  --   CL 99  --   CO2 24  --   GLUCOSE 108*  --   BUN 17  --   CREATININE 0.78 0.93  CALCIUM 6.9*  --     PT/INR: No results found for this basename: LABPROT, INR,  in the last 72 hours  CXR stable  Assessment/Plan: S/P Procedure(s) (LRB): VIDEO ASSISTED THORACOSCOPY (VATS)/EMPYEMA (Left)  ID- leukocytosis improving, no new labs this am.  Tm 103 yesterday, 101 overnight.  U/a showed few bacteria, repeat blood cx's pending. Continue current abx therapy per ID  service.  CT output 300 ml/24 hours, around 50 ml overnight.  Continue CT for now. Hopefully can place to water seal.  VAC change in am for chest wall abscess.    LOS: 7 days    COLLINS,GINA H 08/10/2013  I have seen and examined the patient and agree with the assessment and plan as outlined.  I would favor leaving the chest tube in at least one more day.  CXR looks good.  OWEN,CLARENCE H 08/10/2013 4:24 PM

## 2013-08-10 NOTE — Progress Notes (Signed)
[facility]    Date of Admission:  08/03/2013   Total days of antibiotics 8        Day 8clinda        Day 5 amp/sub           ID: [name] [name] is a 23 y.o. 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 ampicillin-sulbactam (UNASYN) IV  3 g Intravenous Q6H  . clindamycin (CLEOCIN) IV  600 mg Intravenous Q6H  . docusate sodium  200 mg Oral Daily  . influenza vac split quadrivalent PF  0.5 mL Intramuscular Tomorrow-1000    Objective: Vital signs in last 24 hours: Temp:  [98.5 F (36.9 C)-103.1 F (39.5 C)] 102.5 F (39.2 C) (01/22 1200) Pulse Rate:  [84-106] 96 (01/22 0400) Resp:  [21-37] 21 (01/22 0400) BP: (123-134)/(65-74) 129/65 mmHg (01/22 1200) SpO2:  [94 %-99 %] 96 % (01/22 0730) Weight:  [177 lb 0.5 oz (80.3 kg)] 177 lb 0.5 oz (80.3 kg) (01/22 0400) Physical Exam  Constitutional: oriented to person, place, and time. He appears well-developed and well-nourished. No distress.  HENT:  Mouth/Throat: Oropharynx is clear and moist. No oropharyngeal exudate.  Cardiovascular: tachycardic and normal heart sounds. Exam reveals no gallop and no friction rub.  No murmur heard.  Pulmonary/Chest: Effort normal and breath sounds normal. No respiratory distress. He has no wheezes. Left chest tube and ant chest wall wound vac. Chest tube output serous appearing Abdominal: Soft. Bowel sounds are normal. He exhibits no distension. There is no  tenderness.  Lymphadenopathy:  no cervical adenopathy.  Gu= scrotal edema Neurological: alert and oriented to person, place, and time.  Skin: Skin is warm and dry. No rash noted. No erythema.  Psychiatric:  a normal mood and affect.  behavior is normal.    Lab Results  Recent Labs  08/08/13 0240 08/09/13 0415 08/10/13 0325  WBC 31.5* 22.6*  --   HGB 10.9* 10.3*  --   HCT 31.3* 30.0*  --   NA  --  134*  --   K  --  3.8  --   CL  --  99  --   CO2  --  24  --   BUN  --  17  --   CREATININE  --  0.78 0.93   1/21 ua negative Microbiology: 1/21 blood cx NGTD 1/18 left pleural fluid NGTD 1/17 pleural fluid = gpc in prs. No growth 1/16 abscess = group a strep 1/15 blood cx = NGTD  Studies/Results: Dg Chest 2 View  08/10/2013   CLINICAL DATA:  Pleural effusion  EXAM: CHEST  2 VIEW  COMPARISON:  August 09, 2013  FINDINGS: One of the chest tubes is been removed from the left hemi thorax. The other chest tube remains in place. No pneumothorax. There is mild residual effusion on the left, less than 1 day prior. There is probable re-expansion pulmonary edema throughout the left lung. On the right, there is patchy  atelectatic change in the right lower lobe. Right lung is otherwise clear. Heart is upper normal in size with normal pulmonary vascularity.  IMPRESSION: One of the chest tubes on the left is been removed. The other chest tube is present without pneumothorax. There is moderate effusion on the left, smaller than on 1 day prior. There is pulmonary edema throughout the left lung, probably on a reexpansion basis. There is patchy atelectasis in the right base.   Electronically Signed   By: Bretta Bang M.D.   On: 08/10/2013 08:22   Dg Chest Port 1 View  08/09/2013   CLINICAL DATA:  Left-sided empyema  EXAM: PORTABLE CHEST - 1 VIEW  COMPARISON:  Portable chest x-ray of August 08, 2013  FINDINGS: There remains increased density in the left mid and lower hemi thorax. The left  hemidiaphragm remains obscured. Two large caliber chest tubes are present and unchanged in position. At additional tubular structure projects over the mid and lower thorax which is unchanged in appearance. The cardiopericardial silhouette is enlarged though stable. The pulmonary vascularity is mildly prominent. On the right the interstitial markings have improved somewhat. There is no alveolar edema but there is a small right pleural effusion not greatly changed from the previous study.  IMPRESSION: 1. There is persistent increased density on the left consistent with the known empyema. There is no pneumothorax. 2 chest tubes and possibly a third drainage catheter remain on the left and are unchanged in appearance. 2. The appearance of the right mid and lower hemi thorax has improved with some resolution of interstitial edema. A small right pleural effusion persists.   Electronically Signed   By: David  [place]   On: 08/09/2013 07:41     Assessment/Plan: Group A strep nec fasc/sepsis but now having fevers of unknown origin.  1) Group a sepsis with empyelma = continue with amp/sub. Will discontinue clindamycin today  2) Leukocytosis = likely stress response, will recheck cbc with diff today  3) Fever= pan culture from 1/21 did not reveal much information thus far to etiology. Possibly drug fever but no new rash. Will check cbc with diff to see if any eosinophilia, urine eos. Please discontinue any unnecessary lines  [name] [name], [facility] Cell: [phone] Pager: [phone]  08/10/2013, 3:16 PM

## 2013-08-11 ENCOUNTER — Inpatient Hospital Stay (HOSPITAL_COMMUNITY): Payer: Managed Care, Other (non HMO)

## 2013-08-11 LAB — CBC
HCT: 28.3 % — ABNORMAL LOW (ref 39.0–52.0)
Hemoglobin: 9.8 g/dL — ABNORMAL LOW (ref 13.0–17.0)
MCH: 29.4 pg (ref 26.0–34.0)
MCHC: 34.6 g/dL (ref 30.0–36.0)
MCV: 85 fL (ref 78.0–100.0)
Platelets: 252 10*3/uL (ref 150–400)
RBC: 3.33 MIL/uL — ABNORMAL LOW (ref 4.22–5.81)
RDW: 13.5 % (ref 11.5–15.5)
WBC: 17.6 10*3/uL — ABNORMAL HIGH (ref 4.0–10.5)

## 2013-08-11 NOTE — Progress Notes (Addendum)
[address]       [place] [address]             [phone]          5 Days Post-Op Procedure(s) (LRB): VIDEO ASSISTED THORACOSCOPY (VATS)/EMPYEMA (Left)  Subjective: Comfortable, no complaints. Breathing stable.   Objective: Vital signs in last 24 hours: Patient Vitals for the past 24 hrs:  BP Temp Temp src Pulse Resp SpO2 Weight  08/11/13 0752 - 98.8 F (37.1 C) Oral - - - -  08/11/13 0720 134/78 mmHg - - 85 32 100 % 169 lb 8.5 oz (76.9 kg)  08/11/13 0400 135/70 mmHg 98.1 F (36.7 C) Oral 66 24 95 % -  08/11/13 0050 - 98.8 F (37.1 C) Oral - - - -  08/10/13 2335 123/67 mmHg 101.3 F (38.5 C) Oral 108 32 94 % -  08/10/13 1950 129/74 mmHg 100.7 F (38.2 C) Oral 94 32 98 % -  08/10/13 1600 106/70 mmHg 98.7 F (37.1 C) Oral - - - -  08/10/13 1523 - 98.7 F (37.1 C) Oral - - - -  08/10/13 1200 129/65 mmHg 102.5 F (39.2 C) Oral - - - -   Current Weight  08/11/13 169 lb 8.5 oz (76.9 kg)     Intake/Output from previous day: 01/22 0701 - 01/23 0700 In: 1400 [P.O.:1200; IV Piggyback:200] Out: 30 [Chest Tube:30]    PHYSICAL EXAM:  Heart: RRR Lungs: Decreased BS on L Wound: Incisions clean and dry, L chest wall with VAC in place, no surrounding erythema Chest tube: No air leak    Lab Results: CBC: Recent Labs  08/10/13 1606 08/11/13 0240  WBC 20.5* 17.6*  HGB 10.2* 9.8*  HCT 29.6* 28.3*  PLT 227 252   BMET:  Recent Labs  08/09/13 0415 08/10/13 0325  NA 134*  --   K 3.8  --   CL 99  --   CO2 24  --   GLUCOSE 108*  --   BUN 17  --   CREATININE 0.78 0.93  CALCIUM 6.9*  --     PT/INR: No results found for this basename: LABPROT, INR,  in the last 72 hours  CXR: FINDINGS:  Chest tube remains on the left without change. There is No  pneumothorax. There is persistent left lower lobe consolidation with  edema on the left. Right lung is clear except for mild subsegmental  atelectasis in the right base. Heart is mildly  prominent but stable.  The pulmonary vascularity is normal. No adenopathy.  IMPRESSION:  Persistent left lower lobe consolidation and interstitial edema on  the left. Chest tube remains on the left. No pneumothorax. Right  lung is clear except for rather minimal right base atelectasis.     Assessment/Plan: S/P Procedure(s) (LRB): VIDEO ASSISTED THORACOSCOPY (VATS)/EMPYEMA (Left) ID- Continues to spike temps, but WBC trending down.  Urine and blood cx's negative.  Antibiotic regimen narrowed by ID service.  Continue current care. CT output decreased.  CXR stable.  Possibly can d/c CT soon. For VAC change this am.   LOS: 8 days    COLLINS,GINA H 08/11/2013  I have seen and examined the patient and agree with the assessment and plan as outlined.  Will d/c last remaining chest tube.  Still had fevers yesterday but none last 24 hours.  WBC down.  Chest wound examined at bedside.  Nurses have not been removing all of the VAC sponge at last change  on Wednesday.  Wound looks clean.  Instructions clarified.  CXR looks good.  OWEN,CLARENCE H 08/11/2013 10:27 AM

## 2013-08-11 NOTE — Progress Notes (Signed)
  [facility] TEAM 1 - Stepdown/ICU TEAM  Chart reviewed.  All active issues are currently being addressed by TCTS or ID services.  No formal exam today.  Will f/u in AM.   Lonia BloodJeffrey T. Teia Freitas, MD [facility] Office  [phone] Pager [phone]  On-Call/Text Page:      [email or web address]      password [email or web address]

## 2013-08-11 NOTE — Progress Notes (Signed)
[facility]    Date of Admission:  08/03/2013   Total days of antibiotics 9        (Day 8clinda- d/c'd 1/22)        Day 6 amp/sub           ID: [name] [name] is a 23 y.o. 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 He feels better since having chest tubes removed. He underwent wound vac change this morning and reportedly looks good.  Leukocytosis trending down to 17  Medications:  . ampicillin-sulbactam (UNASYN) IV  3 g Intravenous Q6H  . docusate sodium  200 mg Oral Daily  . influenza vac split quadrivalent PF  0.5 mL Intramuscular Tomorrow-1000    Objective: Vital signs in last 24 hours: Temp:  [98.1 F (36.7 C)-102.5 F (39.2 C)] 98.8 F (37.1 C) (01/23 0752) Pulse Rate:  [66-108] 85 (01/23 0720) Resp:  [24-32] 32 (01/23 0720) BP: (106-135)/(65-78) 134/78 mmHg (01/23 0720) SpO2:  [94 %-100 %] 100 % (01/23 0720) Weight:  [169 lb 8.5 oz (76.9 kg)] 169 lb 8.5 oz (76.9 kg) (01/23 0720) Physical Exam  Constitutional: oriented to person, place, and time. He appears well-developed and well-nourished. No distress.  HENT:  Mouth/Throat: Oropharynx is clear and moist. No oropharyngeal exudate.  Cardiovascular: tachycardic and normal heart sounds. Exam reveals no gallop and no friction rub.  No murmur heard.  Pulmonary/Chest: Effort normal and breath sounds normal. No respiratory distress. He has no wheezes.  ant chest wall wound vac. Mild decrease BS on left Abdominal: Soft. Bowel sounds are normal. He  exhibits no distension. There is no tenderness.  Lymphadenopathy:  no cervical adenopathy.  Gu= scrotal edema Neurological: alert and oriented to person, place, and time.  Skin: Skin is warm and dry. No rash noted. No erythema.  Psychiatric:  a normal mood and affect.  behavior is normal.    Lab Results  Recent Labs  08/09/13 0415 08/10/13 0325 08/10/13 1606 08/11/13 0240  WBC 22.6*  --  20.5* 17.6*  HGB 10.3*  --  10.2* 9.8*  HCT 30.0*  --  29.6* 28.3*  NA 134*  --   --   --   K 3.8  --   --   --   CL 99  --   --   --   CO2 24  --   --   --   BUN 17  --   --   --   CREATININE 0.78 0.93  --   --    1/21 ua negative Microbiology: 1/21 blood cx NGTD 1/18 left pleural fluid NGTD 1/17 pleural fluid = gpc in prs. No growth 1/16 abscess = group a strep 1/15 blood cx = NGTD  Studies/Results: Dg Chest 2 View  08/10/2013   CLINICAL DATA:  Pleural effusion  EXAM: CHEST  2 VIEW  COMPARISON:  August 09, 2013  FINDINGS: One of the chest tubes is been removed from the left hemi thorax. The other chest tube remains in  place. No pneumothorax. There is mild residual effusion on the left, less than 1 day prior. There is probable re-expansion pulmonary edema throughout the left lung. On the right, there is patchy atelectatic change in the right lower lobe. Right lung is otherwise clear. Heart is upper normal in size with normal pulmonary vascularity.  IMPRESSION: One of the chest tubes on the left is been removed. The other chest tube is present without pneumothorax. There is moderate effusion on the left, smaller than on 1 day prior. There is pulmonary edema throughout the left lung, probably on a reexpansion basis. There is patchy atelectasis in the right base.   Electronically Signed   By: Bretta Bang M.D.   On: 08/10/2013 08:22   Dg Chest Port 1 View  08/11/2013   CLINICAL DATA:  Empyema  EXAM: PORTABLE CHEST - 1 VIEW  COMPARISON:  August 10, 2013  FINDINGS: Chest tube remains on the  left without change. There is No pneumothorax. There is persistent left lower lobe consolidation with edema on the left. Right lung is clear except for mild subsegmental atelectasis in the right base. Heart is mildly prominent but stable. The pulmonary vascularity is normal. No adenopathy.  IMPRESSION: Persistent left lower lobe consolidation and interstitial edema on the left. Chest tube remains on the left. No pneumothorax. Right lung is clear except for rather minimal right base atelectasis.   Electronically Signed   By: Bretta Bang M.D.   On: 08/11/2013 07:37     Assessment/Plan: Group A strep sepsis with chest wall abscess s/p debridement and empyema s/p chest tube drainage but now having fevers of unknown origin.  1) Group a sepsis with empyema = continue with amp/sub. Will provide easier regimen for discharge  2) Leukocytosis = likely stress response but steadily improving.  will recheck cbc with diff tomorrow  3) Fever= pan culture from 1/21 did not reveal much information thus far to etiology. Fever curve is trending downward slightly in last 24hrs. If still having fevers, may need to consider repeat chest ct to see if any pulmonary abscess  Tywanda Rice, [facility] Cell: [phone] Pager: [phone]  08/11/2013, 11:03 AM

## 2013-08-12 ENCOUNTER — Inpatient Hospital Stay (HOSPITAL_COMMUNITY): Payer: Managed Care, Other (non HMO)

## 2013-08-12 LAB — CBC
HCT: 29.3 % — ABNORMAL LOW (ref 39.0–52.0)
Hemoglobin: 10.1 g/dL — ABNORMAL LOW (ref 13.0–17.0)
MCH: 29.3 pg (ref 26.0–34.0)
MCHC: 34.5 g/dL (ref 30.0–36.0)
MCV: 84.9 fL (ref 78.0–100.0)
PLATELETS: 321 10*3/uL (ref 150–400)
RBC: 3.45 MIL/uL — ABNORMAL LOW (ref 4.22–5.81)
RDW: 13.7 % (ref 11.5–15.5)
WBC: 13.6 10*3/uL — ABNORMAL HIGH (ref 4.0–10.5)

## 2013-08-12 MED ORDER — AMOXICILLIN 500 MG PO CAPS
500.0000 mg | ORAL_CAPSULE | Freq: Three times a day (TID) | ORAL | Status: DC
  Administered 2013-08-12 – 2013-08-13 (×3): 500 mg via ORAL
  Filled 2013-08-12 (×6): qty 1

## 2013-08-12 NOTE — Progress Notes (Addendum)
TCTS DAILY ICU PROGRESS NOTE                   [address]            [place] [address]          [phone]   6 Days Post-Op Procedure(s) (LRB): VIDEO ASSISTED THORACOSCOPY (VATS)/EMPYEMA (Left)  Total Length of Stay:  LOS: 9 days   Subjective: Looks and feels well  Objective: Vital signs in last 24 hours: Temp:  [98.2 F (36.8 C)-100.7 F (38.2 C)] 99 F (37.2 C) (01/24 0724) Pulse Rate:  [79-109] 79 (01/24 0724) Cardiac Rhythm:  [-] Normal sinus rhythm (01/24 0318) Resp:  [20-34] 26 (01/24 0724) BP: (104-137)/(66-85) 137/80 mmHg (01/24 0724) SpO2:  [94 %-100 %] 100 % (01/24 0724) Weight:  [163 lb 5.8 oz (74.1 kg)] 163 lb 5.8 oz (74.1 kg) (01/24 0500)  Filed Weights   08/10/13 0400 08/11/13 0720 08/12/13 0500  Weight: 177 lb 0.5 oz (80.3 kg) 169 lb 8.5 oz (76.9 kg) 163 lb 5.8 oz (74.1 kg)    Weight change:    Hemodynamic parameters for last 24 hours:    Intake/Output from previous day: 01/23 0701 - 01/24 0700 In: 1160 [P.O.:1160] Out: 0   Intake/Output this shift: Total I/O In: 240 [P.O.:240] Out: -   Current Meds: Scheduled Meds: . ampicillin-sulbactam (UNASYN) IV  3 g Intravenous Q6H  . docusate sodium  200 mg Oral Daily  . influenza vac split quadrivalent PF  0.5 mL Intramuscular Tomorrow-1000   Continuous Infusions: . sodium chloride 10 mL/hr at 08/10/13 0532   PRN Meds:.acetaminophen, magnesium hydroxide, morphine injection, ondansetron (ZOFRAN) IV, oxyCODONE, traMADol  General appearance: alert, cooperative and no distress Heart: regular rate and rhythm Lungs: clear to auscultation bilaterally Abdomen: benign Extremities: no edema Wound: Vac in place, other incis healing well  Lab Results: CBC: Recent Labs  08/11/13 0240 08/12/13 0320  WBC 17.6* 13.6*  HGB 9.8* 10.1*  HCT 28.3* 29.3*  PLT 252 321   BMET:  Recent Labs  08/10/13 0325  CREATININE 0.93    PT/INR: No results found for this basename: LABPROT,  INR,  in the last 72 hours Radiology: Dg Chest 2 View  08/12/2013   CLINICAL DATA:  Empyema  EXAM: CHEST  2 VIEW  COMPARISON:  Yesterday  FINDINGS: Left chest tube removed without pneumothorax. Loculated left pleural effusion surrounding the left lung is stable. Interstitial and airspace opacities within the left lung improved. Volume loss at the right base improved.  IMPRESSION: Left chest tube removed without pneumothorax. Stable left pleural the fusion.  Improved basilar volume loss.  Improved interstitial and airspace disease within the left lung.   Electronically Signed   By: Maryclare Bean M.D.   On: 08/12/2013 07:51   Dg Chest Port 1 View  08/11/2013   CLINICAL DATA:  Empyema  EXAM: PORTABLE CHEST - 1 VIEW  COMPARISON:  August 10, 2013  FINDINGS: Chest tube remains on the left without change. There is No pneumothorax. There is persistent left lower lobe consolidation with edema on the left. Right lung is clear except for mild subsegmental atelectasis in the right base. Heart is mildly prominent but stable. The pulmonary vascularity is normal. No adenopathy.  IMPRESSION: Persistent left lower lobe consolidation and interstitial edema on the left. Chest tube remains on the left. No pneumothorax. Right lung is clear except for rather minimal right base atelectasis.   Electronically Signed   By: Chrissie Noa  Margarita GrizzleWoodruff M.D.   On: 08/11/2013 07:37     Assessment/Plan: S/P Procedure(s) (LRB): VIDEO ASSISTED THORACOSCOPY (VATS)/EMPYEMA (Left)  1 excellent progress, only low grade temp. Conts current care     GOLD,WAYNE E 08/12/2013 10:56 AM  Chart reviewed, patient examined, agree with above. No fever since last pm and WBC ct continues to drop. He can have vac change tomorrow and then go home on oral antibiotic as rec by ID.

## 2013-08-12 NOTE — Progress Notes (Signed)
  [place] TEAM 1 - Stepdown/ICU TEAM  Chart reviewed.  All active issues are currently being addressed by TCTS or ID services.  No formal exam today.  Will cont to follow at a distance.    Lonia BloodJeffrey T. Mc Hollen, MD [facility] Office  [phone] Pager [phone]  On-Call/Text Page:      [email or web address]      password [email or web address]

## 2013-08-12 NOTE — Progress Notes (Signed)
Patient ID: [name] [name], male   DOB: 01/11/1991, 23 y.o.   MRN: [id number]         [facility]    Date of Admission:  08/03/2013   Total days of antibiotics 10        Day 7 ampicillin sulbactam         Principal Problem:   Sepsis due to Streptococcus, group A Active Problems:   Chest wall abscess   Pain   Septic shock   ARDS (adult respiratory distress syndrome)   Acute respiratory failure with hypoxia   Protein-calorie malnutrition, severe   Empyema, left   Sepsis   . ampicillin-sulbactam (UNASYN) IV  3 g Intravenous Q6H  . docusate sodium  200 mg Oral Daily  . influenza vac split quadrivalent PF  0.5 mL Intramuscular Tomorrow-1000    Subjective: He is feeling much better and eager to go home. He denies any chest wall pain.  Objective: Temp:  [98.2 F (36.8 C)-100.7 F (38.2 C)] 98.7 F (37.1 C) (01/24 1237) Pulse Rate:  [79-109] 79 (01/24 0724) Resp:  [20-34] 26 (01/24 0724) BP: (104-137)/(66-85) 137/80 mmHg (01/24 0724) SpO2:  [94 %-100 %] 100 % (01/24 0724) Weight:  [74.1 kg (163 lb 5.8 oz)] 74.1 kg (163 lb 5.8 oz) (01/24 0500)  General: He is alert and comfortable watching television with his mother Skin: No rash Lungs: Clear Cor: Regular S1 and S2 with no murmurs VAC dressing over her left anterior chest wound. His nurse reports that there is one 100% red granulation tissue  Lab Results Lab Results  Component Value Date   WBC 13.6* 08/12/2013   HGB 10.1* 08/12/2013   HCT 29.3* 08/12/2013   MCV 84.9 08/12/2013   PLT 321 08/12/2013    Lab Results  Component Value Date   CREATININE 0.93 08/10/2013   BUN 17 08/09/2013   NA 134* 08/09/2013   K 3.8 08/09/2013   CL 99 08/09/2013   CO2 24 08/09/2013    Lab Results  Component Value Date   ALT 31 08/06/2013   AST 81* 08/06/2013   ALKPHOS 78 08/06/2013   BILITOT 1.1 08/06/2013      Microbiology: Recent Results (from the past 240 hour(s))  MRSA PCR SCREENING     Status: None   Collection Time    08/03/13  5:07 PM      Result Value Range Status   MRSA by PCR NEGATIVE  NEGATIVE Final   Comment:            The GeneXpert MRSA Assay (FDA     approved for NASAL specimens     only), is one component of a     comprehensive MRSA colonization     surveillance program. It is not     intended to diagnose MRSA     infection nor to guide or     monitor treatment for     MRSA infections.  CULTURE, BLOOD (ROUTINE X 2)     Status: None   Collection Time    08/03/13  6:40 PM      Result Value Range Status   Specimen Description BLOOD RIGHT HAND   Final   Special Requests BOTTLES DRAWN AEROBIC ONLY 4CC PT ON [facility]   Final   Culture  Setup Time     Final   Value: 08/03/2013 21:12     Performed at [facility]   Culture     Final   Value: NO  GROWTH 5 DAYS     Performed at [facility]   Report Status 08/09/2013 FINAL   Final  CULTURE, BLOOD (ROUTINE X 2)     Status: None   Collection Time    08/03/13  6:46 PM      Result Value Range Status   Specimen Description BLOOD LEFT HAND   Final   Special Requests BOTTLES DRAWN AEROBIC ONLY 1.5CC PT ON [facility]   Final   Culture  Setup Time     Final   Value: 08/03/2013 21:11     Performed at [facility]   Culture     Final   Value: NO GROWTH 5 DAYS     Performed at [facility]   Report Status 08/09/2013 FINAL   Final  ANAEROBIC CULTURE     Status: None   Collection Time    08/04/13  9:52 AM      Result Value Range Status   Specimen Description ABSCESS CHEST LEFT   Final   Special Requests LEFT CHEST WALL   Final   Gram Stain     Final   Value: ABUNDANT WBC PRESENT,BOTH PMN AND MONONUCLEAR     ABUNDANT GRAM POSITIVE COCCI     IN PAIRS IN CHAINS Performed at [facility] Gram Stain Report Called to,Read Back By and Verified With: Gram Stain Report Called to,Read Back By and Verified With: OR ON 08/04/13 AT 1130 BY K SCHULTZ     Performed at [facility]   Culture      Final   Value: NO ANAEROBES ISOLATED     Performed at [facility]   Report Status 08/09/2013 FINAL   Final  CULTURE, ROUTINE-ABSCESS     Status: None   Collection Time    08/04/13  9:52 AM      Result Value Range Status   Specimen Description ABSCESS CHEST LEFT   Final   Special Requests LEFT CHEST WALL   Final   Gram Stain     Final   Value: RARE WBC PRESENT, PREDOMINANTLY PMN     FEW GRAM POSITIVE COCCI IN PAIRS     FEW GRAM POSITIVE COCCI IN CLUSTERS     Performed at [facility]   Culture     Final   Value: MODERATE GROUP A STREP (S.PYOGENES) ISOLATED     Performed at [facility]   Report Status 08/07/2013 FINAL   Final  GRAM STAIN     Status: None   Collection Time    08/04/13  9:52 AM      Result Value Range Status   Specimen Description ABSCESS CHEST LEFT   Final   Special Requests LEFT CHEST WALL   Final   Gram Stain     Final   Value: ABUNDANT WBC PRESENT,BOTH PMN AND MONONUCLEAR     ABUNDANT GRAM POSITIVE COCCI IN PAIRS IN CHAINS     CALLED TO OR 08/04/13 1130 BY K SCHULTZ   Report Status 08/04/2013 FINAL   Final  ANAEROBIC CULTURE     Status: None   Collection Time    08/04/13 10:07 AM      Result Value Range Status   Specimen Description TISSUE CHEST LEFT   Final   Special Requests TISSUE FROM LEFT CHEST WALL ABSCESS   Final   Gram Stain     Final   Value: RARE WBC PRESENT, PREDOMINANTLY MONONUCLEAR     RARE GRAM POSITIVE  COCCI     IN PAIRS IN CHAINS Performed at [facility] DR [name] ON 08/04/13 AT 1158 BY K SCHULTZ     Performed at [facility]   Culture     Final   Value: NO ANAEROBES ISOLATED     Performed at [facility]   Report Status 08/09/2013 FINAL   Final  TISSUE CULTURE     Status: None   Collection Time    08/04/13 10:07 AM      Result Value Range Status   Specimen Description TISSUE CHEST LEFT   Final   Special Requests TISSUE FROM LEFT CHEST WALL ABSCESS   Final   Gram Stain      Final   Value: Performed at [facility] RARE WBC PRESENT,BOTH PMN AND MONONUCLEAR     RARE GRAM POSITIVE COCCI     IN PAIRS IN CHAINS Gram Stain Report Called to,Read Back By and Verified With: Gram Stain Report Called to,Read Back By and Verified With:  DR [name] ON 08/04/13 BY K SCHULTZ CORRECTED RESULTS CALLED TO: JAMES D ON 08/05/13 AT 0830 BY SMIAS     Performed at [facility]   Culture     Final   Value: FEW GROUP A STREP (S.PYOGENES) ISOLATED     Performed at [facility]   Report Status 08/07/2013 FINAL   Final  GRAM STAIN     Status: None   Collection Time    08/04/13 10:07 AM      Result Value Range Status   Specimen Description TISSUE CHEST LEFT   Final   Special Requests TISSUE FROM LEFT CHEST WALL ABSCESS   Final   Gram Stain     Final   Value: RARE WBC PRESENT, PREDOMINANTLY MONONUCLEAR     RARE GRAM POSITIVE COCCI IN PAIRS IN CHAINS     CALLED TO DR [name] 08/04/13 1158 BY K SCHULTZ   Report Status 08/04/2013 FINAL   Final  GRAM STAIN     Status: None   Collection Time    08/05/13  1:50 PM      Result Value Range Status   Specimen Description PLEURAL   Final   Special Requests NONE   Final   Gram Stain     Final   Value: CYTOSPIN SLIDE     WBC PRESENT, PREDOMINANTLY PMN     GRAM POSITIVE COCCI IN PAIRS     CALLED TO DUNLAP,J RN 08/05/13 1754 WOOTEN,K   Report Status 08/05/2013 FINAL   Final  AFB CULTURE WITH SMEAR     Status: None   Collection Time    08/05/13  1:50 PM      Result Value Range Status   Specimen Description PLEURAL FLUID   Final   Special Requests NONE   Final   ACID FAST SMEAR     Final   Value: NO ACID FAST BACILLI SEEN     Performed at [facility]   Culture     Final   Value: CULTURE WILL BE EXAMINED FOR 6 WEEKS BEFORE ISSUING A FINAL REPORT     Performed at [facility]   Report Status PENDING   Incomplete  BODY FLUID CULTURE     Status: None   Collection Time    08/05/13  1:50 PM      Result  Value Range Status   Specimen Description PLEURAL FLUID   Final   Special Requests  NONE   Final   Gram Stain     Final   Value: WBC PRESENT, PREDOMINANTLY PMN     GRAM POSITIVE COCCI IN PAIRS     Gram Stain Report Called to,Read Back By and Verified With: Gram Stain Report Called to,Read Back By and Verified With: Shirlee Limerick RN 08/05/13 1754 WOOTEN K Performed at [facility] CYTOSPIN     Performed at [facility]   Culture     Final   Value: NO GROWTH 3 DAYS     Performed at [facility]   Report Status 08/09/2013 FINAL   Final  FUNGUS CULTURE W SMEAR     Status: None   Collection Time    08/05/13  1:50 PM      Result Value Range Status   Specimen Description PLEURAL FLUID   Final   Special Requests NONE   Final   Fungal Smear     Final   Value: NO YEAST OR FUNGAL ELEMENTS SEEN     Performed at [facility]   Culture     Final   Value: CULTURE IN PROGRESS FOR FOUR WEEKS     Performed at [facility]   Report Status PENDING   Incomplete  AFB CULTURE WITH SMEAR     Status: None   Collection Time    08/06/13 11:10 AM      Result Value Range Status   Specimen Description FLUID LEFT PLEURAL   Final   Special Requests NONE   Final   ACID FAST SMEAR     Final   Value: NO ACID FAST BACILLI SEEN     Performed at [facility]   Culture     Final   Value: CULTURE WILL BE EXAMINED FOR 6 WEEKS BEFORE ISSUING A FINAL REPORT     Performed at [facility]   Report Status PENDING   Incomplete  BODY FLUID CULTURE     Status: None   Collection Time    08/06/13 11:10 AM      Result Value Range Status   Specimen Description FLUID LEFT PLEURAL   Final   Special Requests NONE   Final   Gram Stain     Final   Value: WBC PRESENT, PREDOMINANTLY PMN     NO ORGANISMS SEEN     Performed at [facility]   Culture     Final   Value: NO GROWTH 3 DAYS     Performed at [facility]   Report Status 08/10/2013 FINAL   Final    CULTURE, BLOOD (ROUTINE X 2)     Status: None   Collection Time    08/09/13  1:45 PM      Result Value Range Status   Specimen Description BLOOD RIGHT ARM   Final   Special Requests BOTTLES DRAWN AEROBIC ONLY 5CC   Final   Culture  Setup Time     Final   Value: 08/09/2013 20:11     Performed at [facility]   Culture     Final   Value:        BLOOD CULTURE RECEIVED NO GROWTH TO DATE CULTURE WILL BE HELD FOR 5 DAYS BEFORE ISSUING A FINAL NEGATIVE REPORT     Performed at [facility]   Report Status PENDING   Incomplete  CULTURE, BLOOD (ROUTINE X 2)     Status: None   Collection Time    08/09/13  3:50 PM      Result Value Range Status   Specimen Description BLOOD RIGHT HAND   Final   Special Requests BOTTLES DRAWN AEROBIC ONLY 8CC   Final   Culture  Setup Time     Final   Value: 08/09/2013 20:11     [facility]   Culture     Final   Value:        BLOOD CULTURE RECEIVED NO GROWTH TO DATE CULTURE WILL BE HELD FOR 5 DAYS BEFORE ISSUING A FINAL NEGATIVE REPORT     [facility]   Report Status PENDING   Incomplete  URINE CULTURE     Status: None   Collection Time    08/09/13  5:50 PM      Result Value Range Status   Specimen Description URINE, CLEAN CATCH   Final   Special Requests NONE   Final   Culture  Setup Time     Final   Value: 08/09/2013 18:59     [facility]   Colony Count     Final   Value: NO GROWTH     [facility]   Culture     Final   Value: NO GROWTH     [facility]   Report Status 08/10/2013 FINAL   Final    Studies/Results: Dg Chest 2 View  08/12/2013   CLINICAL DATA:  Empyema  EXAM: CHEST  2 VIEW  COMPARISON:  Yesterday  FINDINGS: Left chest tube removed without pneumothorax. Loculated left pleural effusion surrounding the left lung is stable. Interstitial and airspace opacities within the left lung improved. Volume loss at the right  base improved.  IMPRESSION: Left chest tube removed without pneumothorax. Stable left pleural the fusion.  Improved basilar volume loss.  Improved interstitial and airspace disease within the left lung.   Electronically Signed   By: Maryclare BeanArt  Hoss M.D.   On: 08/12/2013 07:51   Dg Chest Port 1 View  08/11/2013   CLINICAL DATA:  Empyema  EXAM: PORTABLE CHEST - 1 VIEW  COMPARISON:  August 10, 2013  FINDINGS: Chest tube remains on the left without change. There is No pneumothorax. There is persistent left lower lobe consolidation with edema on the left. Right lung is clear except for mild subsegmental atelectasis in the right base. Heart is mildly prominent but stable. The pulmonary vascularity is normal. No adenopathy.  IMPRESSION: Persistent left lower lobe consolidation and interstitial edema on the left. Chest tube remains on the left. No pneumothorax. Right lung is clear except for rather minimal right base atelectasis.   Electronically Signed   By: Bretta BangWilliam  Woodruff M.D.   On: 08/11/2013 07:37    Assessment: He is improving after IND of left chest wall abscess in 10 days of antibiotics for group A streptococcal infection. I will change him to oral amoxicillin and plan on one more week of therapy.  Plan: 1. Change IV ampicillin sulbactam 2 amoxicillin 500 mg 3 times daily for 7 more days 2. I will sign off now. Please call if we can be of further assistance.  Cliffton AstersJohn Perlie Stene, MD [facility] [phone] pager   [phone] cell 08/12/2013, 1:03 PM

## 2013-08-13 MED ORDER — OXYCODONE HCL 5 MG PO TABS: 5.0000 mg | ORAL_TABLET | Freq: Four times a day (QID) | ORAL | Status: DC | PRN

## 2013-08-13 MED ORDER — AMOXICILLIN 500 MG PO CAPS: 500.0000 mg | ORAL_CAPSULE | Freq: Three times a day (TID) | ORAL | Status: DC

## 2013-08-13 NOTE — Progress Notes (Signed)
Pt d/c to home with mother, both given d/c instruction and verbalized understanding.  Wound vac removed and mother taught how to place wet to dry drsg over sight, supplies given on d/c.  [facility] scheduled to arrive with wound vac 08/14/13 am.

## 2013-08-13 NOTE — Progress Notes (Signed)
[facility] DAILY ICU PROGRESS NOTE                   [address]            [place] [id number]          [phone]   7 Days Post-Op Procedure(s) (LRB): VIDEO ASSISTED THORACOSCOPY (VATS)/EMPYEMA (Left)  Total Length of Stay:  LOS: 10 days   Subjective: conts to do very well   Objective: Vital signs in last 24 hours: Temp:  [98.2 F (36.8 C)-101.2 F (38.4 C)] 100 F (37.8 C) (01/25 0717) Pulse Rate:  [83-95] 88 (01/25 0717) Cardiac Rhythm:  [-] Normal sinus rhythm (01/25 0717) Resp:  [22-30] 30 (01/25 0717) BP: (119-135)/(60-75) 129/75 mmHg (01/25 0717) SpO2:  [96 %-100 %] 98 % (01/25 0717) Weight:  [154 lb 12.2 oz (70.2 kg)] 154 lb 12.2 oz (70.2 kg) (01/25 0500)  Filed Weights   08/11/13 0720 08/12/13 0500 08/13/13 0500  Weight: 169 lb 8.5 oz (76.9 kg) 163 lb 5.8 oz (74.1 kg) 154 lb 12.2 oz (70.2 kg)    Weight change: -14 lb 12.3 oz (-6.7 kg)   Hemodynamic parameters for last 24 hours:    Intake/Output from previous day: 01/24 0701 - 01/25 0700 In: 600 [P.O.:600] Out: 0   Intake/Output this shift: Total I/O In: 240 [P.O.:240] Out: -   Current Meds: Scheduled Meds: . amoxicillin  500 mg Oral Q8H  . docusate sodium  200 mg Oral Daily   Continuous Infusions: . sodium chloride 10 mL/hr at 08/10/13 0532   PRN Meds:.acetaminophen, magnesium hydroxide, morphine injection, ondansetron (ZOFRAN) IV, oxyCODONE, traMADol  General appearance: alert, cooperative and no distress Heart: regular rate and rhythm Lungs: clear to auscultation bilaterally Wound: vac in place, healing well  Lab Results: CBC: Recent Labs  08/11/13 0240 08/12/13 0320  WBC 17.6* 13.6*  HGB 9.8* 10.1*  HCT 28.3* 29.3*  PLT 252 321   BMET: No results found for this basename: NA, K, CL, CO2, GLUCOSE, BUN, CREATININE, CALCIUM,  in the last 72 hours  PT/INR: No results found for this basename: LABPROT, INR,  in the last 72 hours Radiology: Dg Chest 2 View  08/12/2013    CLINICAL DATA:  Empyema  EXAM: CHEST  2 VIEW  COMPARISON:  Yesterday  FINDINGS: Left chest tube removed without pneumothorax. Loculated left pleural effusion surrounding the left lung is stable. Interstitial and airspace opacities within the left lung improved. Volume loss at the right base improved.  IMPRESSION: Left chest tube removed without pneumothorax. Stable left pleural the fusion.  Improved basilar volume loss.  Improved interstitial and airspace disease within the left lung.   Electronically Signed   By: Maryclare BeanArt  Hoss M.D.   On: 08/12/2013 07:51     Assessment/Plan: S/P Procedure(s) (LRB): VIDEO ASSISTED THORACOSCOPY (VATS)/EMPYEMA (Left) Plan for discharge: see discharge orders     GOLD,WAYNE E 08/13/2013 10:36 AM

## 2013-08-13 NOTE — Progress Notes (Signed)
   CARE MANAGEMENT NOTE 08/13/2013  Patient:  [name] [name],[name] [name]   Account Number:  [id number]  Date Initiated:  08/04/2013  Documentation initiated by:  [facility] Ps[facility]OWN,Viggo Perko  Subjective/Objective Assessment:   Admitted with chest abscess - septic - plan for surgery 1-16     Action/Plan:   Anticipated DC Date:  08/14/2013   Anticipated DC Plan:  HOME W HOME HEALTH SERVICES      DC Planning Services  CM consult      [facility]   Choice offered to / List presented to:  C-6 Parent   DME arranged  VAC      DME agency  [facility]     HH arranged  HH-1 RN      [facility]  [facility]   Status of service:  Completed, signed off Medicare Important Message given?   (If response is "NO", the following Medicare IM given date fields will be blank) Date Medicare IM given:   Date Additional Medicare IM given:    Discharge Disposition:  HOME W HOME HEALTH SERVICES  Per UR Regulation:  Reviewed for med. necessity/level of care/duration of stay  If discussed at Long Length of Stay Meetings, dates discussed:   08/11/2013    Comments:  08/12/13 09:30 CM faxed [facility] referral to [facility] and called [phone] to verify acceptance.  Bonita QuinLinda of [facility] states they [facility] have [facility] bc insurance cannot be verified on a Sunday.  CM spoke with pt and [name], [name], to offer choice.  [facility] and wound vac placement when wound vac is delivered to home.  MD aware [facility] states insurance cannot be verified until Monday 08/14/13 and ok for pt to go home with Wet to dry dressing changes by Mom until [facility].  [facility] states they will have an RN to the house when Mom calls to let [facility] know [facility] been delivered.  Address and contact numbers verified and [facility]  No other CM needs were communicated. Freddy JakschSarah Ainslie Mazurek, BSN, CM [phone].   Contact:  Amer,[name] [name] [phone]  08-10-13 Chest wall abscess-  Days Post-Op Procedure(s) VATS- Chest tube: Plan  for home when medically stable. Tomi BambergerBrenda Graves- Bigelow, RN, BSN [phone]   08-07-13 3pm Avie ArenasSarah Brown, RNBSN - [phone] Wound VAC replaced - plan for closure of wound prior to dc. CM will continue to follow. Per Rosann Auerbachigna - H/Infusion and DME thru [facility] ph# [phone] fax [phone]

## 2013-08-13 NOTE — Discharge Instructions (Signed)
Abscess An abscess is an infected area that contains a collection of pus and debris.It can occur in almost any part of the body. An abscess is also known as a furuncle or boil. CAUSES  An abscess occurs when tissue gets infected. This can occur from blockage of oil or sweat glands, infection of hair follicles, or a minor injury to the skin. As the body tries to fight the infection, pus collects in the area and creates pressure under the skin. This pressure causes pain. People with weakened immune systems have difficulty fighting infections and get certain abscesses more often.  SYMPTOMS Usually an abscess develops on the skin and becomes a painful mass that is red, warm, and tender. If the abscess forms under the skin, you may feel a moveable soft area under the skin. Some abscesses break open (rupture) on their own, but most will continue to get worse without care. The infection can spread deeper into the body and eventually into the bloodstream, causing you to feel ill.  DIAGNOSIS  Your caregiver will take your medical history and perform a physical exam. A sample of fluid may also be taken from the abscess to determine what is causing your infection. TREATMENT  Your caregiver may prescribe antibiotic medicines to fight the infection. However, taking antibiotics alone usually does not cure an abscess. Your caregiver may need to make a small cut (incision) in the abscess to drain the pus. In some cases, gauze is packed into the abscess to reduce pain and to continue draining the area. HOME CARE INSTRUCTIONS   Only take over-the-counter or prescription medicines for pain, discomfort, or fever as directed by your caregiver.  If you were prescribed antibiotics, take them as directed. Finish them even if you start to feel better.  If gauze is used, follow your caregiver's directions for changing the gauze.  To avoid spreading the infection:  Keep your draining abscess covered with a  bandage.  Wash your hands well.  Do not share personal care items, towels, or whirlpools with others.  Avoid skin contact with others.  Keep your skin and clothes clean around the abscess.  Keep all follow-up appointments as directed by your caregiver. SEEK MEDICAL CARE IF:   You have increased pain, swelling, redness, fluid drainage, or bleeding.  You have muscle aches, chills, or a general ill feeling.  You have a fever. MAKE SURE YOU: Thoracoscopy Care After Refer to this sheet in the next few weeks. These discharge instructions provide you with general information on caring for yourself after you leave the hospital. Your caregiver may also give you specific instructions. Your treatment has been planned according to the most current medical practices available, but unavoidable complications sometimes occur. If you have any problems or questions after discharge, call your caregiver. HOME CARE INSTRUCTIONS   Remove the bandage (dressing) over your chest tube site as directed by your caregiver.  It is normal to be sore for a couple weeks following surgery. See your caregiver if this seems to be getting worse rather than better.  Only take over-the-counter or prescription medicines for pain, discomfort, or fever as directed by your caregiver. It is very important to take pain medicine when you need it so that you will cough and breathe deeply enough to clear mucus (phlegm) and expand your lungs.  If it hurts to cough, hold a pillow against your chest when you cough. This may help with the discomfort. In spite of the discomfort, cough frequently, as this helps  protect against getting an infection in your lung (pneumonia).  Taking deep breaths keeps lungs inflated and protects against pneumonia. Most patients will go home with an incentive spirometer that encourages deep breathing.  You may resume a normal diet and activities as directed.  Use showers for bathing until you see your  caregiver, or as instructed.  Change dressings if necessary or as directed.  Avoid lifting or driving until you are instructed otherwise.  Make an appointment to see your caregiver for stitch (suture) or staple removal when instructed.  Do not travel by airplane for 2 weeks after the chest tube is removed. SEEK MEDICAL CARE IF:   You are bleeding from your wounds.  You have redness, swelling, or increasing pain in the wounds.  Your heartbeat feels irregular or very fast.  There is pus coming from your wounds.  There is a bad smell coming from the wound or dressing. SEEK IMMEDIATE MEDICAL CARE IF:   You have a fever.  You develop a rash.  You have difficulty breathing.  You develop any reaction or side effects to medicines given.  You develop lightheadedness or feel faint.  You develop shortness of breath or chest pain. MAKE SURE YOU:   Understand these instructions.  Will watch your condition.  Will get help right away if you are not doing well or get worse. Document Released: 01/23/2005 Document Revised: 09/28/2011 Document Reviewed: 12/24/2010 [facility] Patient Information 2014 [place], [place].   Understand these instructions.  Will watch your condition.  Will get help right away if you are not doing well or get worse. Document Released: 04/15/2005 Document Revised: 01/05/2012 Document Reviewed: 09/18/2011 [facility] Patient Information 2014 [place], [place].

## 2013-08-15 LAB — CULTURE, BLOOD (ROUTINE X 2)
CULTURE: NO GROWTH
Culture: NO GROWTH

## 2013-08-21 ENCOUNTER — Other Ambulatory Visit: Payer: Self-pay | Admitting: *Deleted

## 2013-08-21 DIAGNOSIS — L02219 Cutaneous abscess of trunk, unspecified: Secondary | ICD-10-CM

## 2013-08-21 DIAGNOSIS — L03319 Cellulitis of trunk, unspecified: Principal | ICD-10-CM

## 2013-08-23 ENCOUNTER — Encounter: Payer: Self-pay | Admitting: Cardiothoracic Surgery

## 2013-08-23 ENCOUNTER — Ambulatory Visit
Admission: RE | Admit: 2013-08-23 | Discharge: 2013-08-23 | Disposition: A | Discharge status: Home / Self Care | Payer: Managed Care, Other (non HMO) | Source: Ambulatory Visit | Attending: Cardiothoracic Surgery | Admitting: Cardiothoracic Surgery

## 2013-08-23 ENCOUNTER — Ambulatory Visit (INDEPENDENT_AMBULATORY_CARE_PROVIDER_SITE_OTHER): Payer: Self-pay | Admitting: Cardiothoracic Surgery

## 2013-08-23 VITALS — BP 115/83 | HR 114 | Temp 97.4°F | Resp 16 | Ht 70.0 in | Wt 142.0 lb

## 2013-08-23 DIAGNOSIS — L02219 Cutaneous abscess of trunk, unspecified: Secondary | ICD-10-CM

## 2013-08-23 DIAGNOSIS — Z09 Encounter for follow-up examination after completed treatment for conditions other than malignant neoplasm: Secondary | ICD-10-CM

## 2013-08-23 DIAGNOSIS — L03319 Cellulitis of trunk, unspecified: Principal | ICD-10-CM

## 2013-08-23 NOTE — Progress Notes (Signed)
PCP is Juliette AlcideBURDINE,STEVEN E, MD Referring Provider is Merwyn KatosSimonds, David B, MD  Chief Complaint  Patient presents with  . Routine Post Op    1 wk f/u open wound    HPI: 23 year old nondiabetic returns for first office visit after hospitalization for Streptococcus left anterior chest wall abscess treated with debridement and wound VAC and a subsequent left empyema treated with VATS and drainage. This completed an extended course of amoxicillin and is clinically doing well without fever. His wound VAC is been changed to wet-to-dry dressings for the left anterior chest wound and is completely 100% granulation tissue and clean. His chest x-ray today shows no pleural effusion or infiltrate. His chest tube sites from the VATS procedure have been clean with peroxide and packed with 1 inch Nu Gauze. The patient is still it restricted activities and is not working.  Past Medical History  Diagnosis Date  . Medical history non-contributory     Past Surgical History  Procedure Laterality Date  . No past surgeries    . Sternal wound debridement N/A 08/04/2013    Procedure: CHEST WALL ABCESS DEBRIDEMENT;  Surgeon: Kerin PernaPeter [name], MD;  [facility];  Service: Thoracic;  Laterality: N/A;  . Application of wound vac Left 08/04/2013    Procedure: 2 Ply APPLICATION OF WOUND VAC;  Surgeon: Kerin PernaPeter [name], MD;  [facility];  Service: Thoracic;  Laterality: Left;  . Video assisted thoracoscopy (vats)/empyema Left 08/06/2013    Procedure: VIDEO ASSISTED THORACOSCOPY (VATS)/EMPYEMA;  Surgeon: Purcell Nailslarence H Owen, MD;  [facility];  Service: Thoracic;  Laterality: Left;    No family history on file.  Social History History  Substance Use Topics  . Smoking status: Never Smoker   . Smokeless tobacco: Never Used  . Alcohol Use: No    Current Outpatient Prescriptions  Medication Sig Dispense Refill  . oxyCODONE (OXY IR/ROXICODONE) 5 MG immediate release tablet Take 1-2 tablets (5-10 mg total) by mouth every  6 (six) hours as needed for moderate pain.  40 tablet  0   No current facility-administered medications for this visit.    No Known Allergies  Review of Systems improved strength and appetite but slowly, the patient lost 15 pounds during the illness  BP 115/83  Pulse 114  Temp(Src) 97.4 F (36.3 C) (Oral)  Resp 16  Ht 5\' 10"  (1.778 m)  Wt 142 lb (64.411 kg)  BMI 20.37 kg/m2 Physical Exam Alert and comfortable Breath sounds clear Left anterior chest wound contracting incompletely granulated, very shallow no cellulitis Chest tube sutures are removed and the wound clean with peroxide saline. To the chest tube sites were packed with 1 inch Nu Gauze wet-to-dry saline  Diagnostic Tests: Chest x-rays clear     Impression-Plan: Doing well Change to daily wet-to-dry dressings for his left chest wound and left chest tube sites His mother will probably be able to take over wound care  from the home health nurse Return to this office for wound check in one week Patient will not be able. to return to work for at least 2 weeks

## 2013-08-25 NOTE — Discharge Summary (Signed)
[address]       [place] [id number]             [phone]       [name] IdlerJoshua [name] Feb 06, 1991 23 y.o. [id number]  08/03/2013 08/13/2013  No att. providers found  Chest Wall Abscess CHEST WALL ABBCESS STERNAL WOUND INFECTION empyema   HPI:  This is a 23 y.o. male who is noted to be previously healthy who presented with swelling and erythema associated with tenderness and pain over the left anterior chest without preceding causative events. He is a nonsmoker. He has no history of diabetes. Prior to presentation the patient did develop some nausea and vomiting he felt to be symptoms of gastroenteritis. He denied any violent emesis. There was no history of spider bite or insect bite or any trauma. His initial discomfort was in the axilla. He has no previous history of serious infection or immune deficiency. A CT scan of the chest demonstrated significant swelling in the anterior chest wall with a sub-pectoral fluid collection 3 x4 cm. The left sternal clavicular joint is not destroyed or abnormal. There was no intrathoracic abnormality other than a small pleural effusion and some pleural thickening of the anterior chest. Great vessels were noted to be normal and there was no evidence of a pericardial effusion. He was admitted to the critical care service and thoracic surgical consultation was also obtained.  No past medical history on file.  No past surgical history on file.  History   Smoking status   .  Not on file   Smokeless tobacco   .  Not on file    History   Alcohol Use:  Not on file    History    Social History   .  Marital Status:  Single     Spouse Name:  N/A     Number of Children:  N/A   .  Years of Education:  N/A    Occupational History   .  Not on file.    Social History Main Topics   .  Smoking status:  Not on file   .  Smokeless tobacco:  Not on file   .  Alcohol Use:  Not on file   .  Drug Use:  Not on file   .  Sexual Activity:   Not on file    Other Topics  Concern   .  Not on file    Social History Narrative   .  No narrative on file    No Known Allergies   Hospital Course:  Thoracic surgery recommended drainage and debridement of the anterior chest with placement of a wound VAC. On 08/04/2013 he was taken the operating room at which time the following procedures on:  DATE OF PROCEDURE: 08/04/2013  DATE OF DISCHARGE:  OPERATIVE REPORT  OPERATIONS:  1. Incision and drainage with debridement of left anterior chest wall  abscess.  2. Placement of wound VAC drainage system.  SURGEON: Kerin PernaPeter [name], MD  PREOPERATIVE DIAGNOSIS: Abscess with surrounding cellulitis of the left  anterior chest wall - subpectoral location.  POSTOPERATIVE DIAGNOSIS: Abscess with surrounding cellulitis of the  left anterior chest wall - subpectoral location.  ANESTHESIA: General.  The patient was extubated and returned to the recovery room  in stable condition.  Postoperative hospital course: He initially did quite well following his surgery but subsequently developed findings consistent with a left empyema. This required return to the operating room on  08/06/2013 at which time he underwent the following procedure:  CARDIOTHORACIC SURGERY OPERATIVE NOTE  Date of Procedure: 08/06/2013  Preoperative Diagnosis: Acute Empyema  Postoperative Diagnosis: same  Procedure: Left Video-assisted Thoracoscopy for Drainage of Empyema  Surgeon: Salvatore Decent. Cornelius Moras, MD  Assistant: Lowella Dandy, PA-C and Jari Favre, PA-S  Anesthesia: Laverle Hobby, MD  Operative Findings: Acute suppurative empyema  The patient tolerated the procedure well, was extubated in the operative room, and transported to the postanesthesia care in stable condition. All sponge instrument and needle counts are verified correct. There were no intraoperative complications. Estimated blood loss was < 100 mL.  Postoperative hospital course following second procedure:  The  patient progressed nicely. A wound VAC was placed and infectious disease consultation was obtained who assisted with antibiotic management. Cultures did return as a group a strep pyogenes. Treatment included intravenous Unasyn and Cleocin. Leukocytosis do to improve over time. The chest tube was managed using standard protocols and discontinued when the drainage was quite minimal. Prior to discharge antibiotics were changed to oral amoxicillin. He continued to progress nicely in regards to the appearance of his wounds and overall tolerance of activities. At time of discharge he was quite stable.   No results found for this basename: NA, K, CL, CO2, GLUCOSE, BUN, CRATININE, CALCIUM,  in the last 72 hours No results found for this basename: WBC, HGB, HCT, PLT,  in the last 72 hours No results found for this basename: INR,  in the last 72 hours   Discharge Instructions:  The patient is discharged to home with extensive instructions on wound care and progressive ambulation.  They are instructed not to drive or perform any heavy lifting until returning to see the physician in his office.  Discharge Diagnosis:  Chest Wall Abscess CHEST WALL ABBCESS STERNAL WOUND INFECTION empyema  Secondary Diagnosis: Patient Active Problem List   Diagnosis Date Noted  . Empyema, left 08/06/2013  . Sepsis due to Streptococcus, group A 08/06/2013  . Sepsis 08/06/2013  . ARDS (adult respiratory distress syndrome) 08/05/2013  . Acute respiratory failure with hypoxia 08/05/2013  . Protein-calorie malnutrition, severe 08/05/2013  . Septic shock 08/04/2013  . Chest wall abscess 08/03/2013  . Pain 08/03/2013   Past Medical History  Diagnosis Date  . Medical history non-contributory         Medication List    STOP taking these medications       TAMIFLU 75 MG capsule  Generic drug:  oseltamivir      TAKE these medications       oxyCODONE 5 MG immediate release tablet  Commonly known as:  Oxy  IR/ROXICODONE  Take 1-2 tablets (5-10 mg total) by mouth every 6 (six) hours as needed for moderate pain.       Follow-up Information   Follow up with [facility]. (home health nurse for wound care (call when [facility] delivers wound vac))    Contact information:   [address] [place] [address] [phone]       Follow up with [facility] . (call if wound vac is not delivered by 3:00pm Monday 08/13/13)    Contact information:   [phone]      Follow up with VAN Dinah Beers, MD. (1 week- office will contact you. Please also obtain a chest xray from [facility] 1 hour prior to appt with the surgeon)    Specialty:  Cardiothoracic Surgery   Contact information:   [address]  [place] [address] [phone]       Disposition: home  Patient's condition is Good  Gershon Crane, PA-C 08/25/2013  1:35 PM

## 2013-08-28 NOTE — Discharge Summary (Signed)
patient examined and medical record reviewed,agree with above note. [name] III,PETER 08/28/2013   

## 2013-08-30 ENCOUNTER — Encounter: Payer: Self-pay | Admitting: Cardiothoracic Surgery

## 2013-08-30 ENCOUNTER — Ambulatory Visit (INDEPENDENT_AMBULATORY_CARE_PROVIDER_SITE_OTHER): Payer: Self-pay | Admitting: Cardiothoracic Surgery

## 2013-08-30 VITALS — BP 124/78 | HR 112 | Resp 20 | Ht 70.0 in | Wt 142.0 lb

## 2013-08-30 DIAGNOSIS — L03319 Cellulitis of trunk, unspecified: Principal | ICD-10-CM

## 2013-08-30 DIAGNOSIS — Z09 Encounter for follow-up examination after completed treatment for conditions other than malignant neoplasm: Secondary | ICD-10-CM

## 2013-08-30 DIAGNOSIS — L02219 Cutaneous abscess of trunk, unspecified: Secondary | ICD-10-CM

## 2013-08-30 NOTE — Progress Notes (Signed)
PCP is Juliette AlcideBURDINE,STEVEN E, MD Referring Provider is Burdine, Ananias PilgrimSteven E, MD  Chief Complaint  Patient presents with  . Routine Post Op    1 week f/u, check incision sites    HPI: Patient returns for scheduled one week followup. We are treating his left anterior chest wound with saline wet-to-dry dressings daily. We are treating 2 chest tube sites with 1 inch Nu Gauze packing daily. He is off oral antibiotics. Patient denies fever or pain or shortness of breath. The incisions are healing nicely. His family is taking care of the dressing changes.  Patient is currently not working but should be able to return to work after his next office visit in 2 weeks.   Past Medical History  Diagnosis Date  . Medical history non-contributory     Past Surgical History  Procedure Laterality Date  . No past surgeries    . Sternal wound debridement N/A 08/04/2013    Procedure: CHEST WALL ABCESS DEBRIDEMENT;  Surgeon: Kerin PernaPeter [name], MD;  [facility];  Service: Thoracic;  Laterality: N/A;  . Application of wound vac Left 08/04/2013    Procedure: 2 Ply APPLICATION OF WOUND VAC;  Surgeon: Kerin PernaPeter [name], MD;  [facility];  Service: Thoracic;  Laterality: Left;  . Video assisted thoracoscopy (vats)/empyema Left 08/06/2013    Procedure: VIDEO ASSISTED THORACOSCOPY (VATS)/EMPYEMA;  Surgeon: Purcell Nailslarence H Owen, MD;  [facility];  Service: Thoracic;  Laterality: Left;    No family history on file.  Social History History  Substance Use Topics  . Smoking status: Never Smoker   . Smokeless tobacco: Never Used  . Alcohol Use: No    Current Outpatient Prescriptions  Medication Sig Dispense Refill  . oxyCODONE (OXY IR/ROXICODONE) 5 MG immediate release tablet Take 1-2 tablets (5-10 mg total) by mouth every 6 (six) hours as needed for moderate pain.  40 tablet  0   No current facility-administered medications for this visit.    No Known Allergies  Review of Systems patient feels well  complaint  BP 124/78  Pulse 112  Resp 20  Ht 5\' 10"  (1.778 m)  Wt 142 lb (64.411 kg)  BMI 20.37 kg/m2  SpO2 99% Physical Exam Alert and comfortable Lungs clear Heart rate regular Left anterior chest wound 100% clean granulation tissue with contraction of the wound wet-to-dry dressing changed personally. Left lateral chest tube sites cleaned with saline. Posterior site was packed with gauze wet-to-dry dressing. The anterior site was healed to the dermis and a Neosporin Band-Aid dressing was applied.   Diagnostic Tests: None  Impression: Continue daily dressing changes as above  Plan:Return in 2 weeks for wound assessment. Patient should be able. to return to work after his next office visit

## 2013-09-05 LAB — FUNGUS CULTURE W SMEAR: Fungal Smear: NONE SEEN

## 2013-09-13 ENCOUNTER — Encounter: Payer: Self-pay | Admitting: Cardiothoracic Surgery

## 2013-09-13 ENCOUNTER — Ambulatory Visit (INDEPENDENT_AMBULATORY_CARE_PROVIDER_SITE_OTHER): Payer: Self-pay | Admitting: Cardiothoracic Surgery

## 2013-09-13 VITALS — BP 128/84 | HR 90 | Resp 20 | Ht 70.0 in | Wt 142.0 lb

## 2013-09-13 DIAGNOSIS — Z09 Encounter for follow-up examination after completed treatment for conditions other than malignant neoplasm: Secondary | ICD-10-CM

## 2013-09-13 DIAGNOSIS — L02219 Cutaneous abscess of trunk, unspecified: Secondary | ICD-10-CM

## 2013-09-13 DIAGNOSIS — L03319 Cellulitis of trunk, unspecified: Principal | ICD-10-CM

## 2013-09-13 NOTE — Progress Notes (Signed)
PCP is Juliette AlcideBURDINE,STEVEN E, MD Referring Provider is Burdine, Ananias PilgrimSteven E, MD  Chief Complaint  Patient presents with  . Routine Post Op    2 week f/u for wound assessment and discuss RTW date    HPI: Patient returns for wound check and left anterior chest Patient currently back to work Wound is granulating in nicely No pain medications and pain is minimal   Past Medical History  Diagnosis Date  . Medical history non-contributory     Past Surgical History  Procedure Laterality Date  . No past surgeries    . Sternal wound debridement N/A 08/04/2013    Procedure: CHEST WALL ABCESS DEBRIDEMENT;  Surgeon: Kerin PernaPeter [name], MD;  [facility];  Service: Thoracic;  Laterality: N/A;  . Application of wound vac Left 08/04/2013    Procedure: 2 Ply APPLICATION OF WOUND VAC;  Surgeon: Kerin PernaPeter [name], MD;  [facility];  Service: Thoracic;  Laterality: Left;  . Video assisted thoracoscopy (vats)/empyema Left 08/06/2013    Procedure: VIDEO ASSISTED THORACOSCOPY (VATS)/EMPYEMA;  Surgeon: Purcell Nailslarence H Owen, MD;  [facility];  Service: Thoracic;  Laterality: Left;    No family history on file.  Social History History  Substance Use Topics  . Smoking status: Never Smoker   . Smokeless tobacco: Never Used  . Alcohol Use: No    No current outpatient prescriptions on file.   No current facility-administered medications for this visit.    No Known Allergies  Review of Systems no new problems  BP 128/84  Pulse 90  Resp 20  Ht 5\' 10"  (1.778 m)  Wt 142 lb (64.411 kg)  BMI 20.37 kg/m2  SpO2 99% Physical Exam Alert and comfortable Lungs clear to auscultation Heart rhythm regular Left anterior chest wound 100% granulation very shallow continuing wet-to-dry dressing Chest tube site healed  Diagnostic Tests: None  Impression: Healing left chest wound with wet-to-dry dressings daily Continue daily wet-to-dry dressings Continue current activity  Plan: Return for wound check in  4 weeks

## 2013-09-17 LAB — AFB CULTURE WITH SMEAR (NOT AT ARMC): Acid Fast Smear: NONE SEEN

## 2013-09-18 LAB — AFB CULTURE WITH SMEAR (NOT AT ARMC): Acid Fast Smear: NONE SEEN

## 2013-10-04 ENCOUNTER — Encounter: Payer: Self-pay | Admitting: Cardiothoracic Surgery

## 2013-10-04 ENCOUNTER — Ambulatory Visit (INDEPENDENT_AMBULATORY_CARE_PROVIDER_SITE_OTHER): Payer: Managed Care, Other (non HMO) | Admitting: Cardiothoracic Surgery

## 2013-10-04 VITALS — BP 121/72 | HR 82 | Resp 16 | Ht 70.0 in | Wt 142.0 lb

## 2013-10-04 DIAGNOSIS — Z09 Encounter for follow-up examination after completed treatment for conditions other than malignant neoplasm: Secondary | ICD-10-CM

## 2013-10-04 DIAGNOSIS — L03319 Cellulitis of trunk, unspecified: Principal | ICD-10-CM

## 2013-10-04 DIAGNOSIS — L02219 Cutaneous abscess of trunk, unspecified: Secondary | ICD-10-CM

## 2013-10-04 NOTE — Progress Notes (Signed)
PCP is Juliette AlcideBURDINE,STEVEN E, MD Referring Provider is Burdine, Ananias PilgrimSteven E, MD  Chief Complaint  Patient presents with  . Routine Post Op    3 wk f/u with wound check    HPI: Final office visit for surgical drainage of a left anterior chest wall abscess The wound is now completely healed. The patient is off antibiotics. He is back to work.  The patient had a left VATS for empyema and he is also recovered from that with a normal chest x-ray. He is having no surgical pain.  Past Medical History  Diagnosis Date  . Medical history non-contributory     Past Surgical History  Procedure Laterality Date  . No past surgeries    . Sternal wound debridement N/A 08/04/2013    Procedure: CHEST WALL ABCESS DEBRIDEMENT;  Surgeon: Kerin PernaPeter [name], MD;  [facility];  Service: Thoracic;  Laterality: N/A;  . Application of wound vac Left 08/04/2013    Procedure: 2 Ply APPLICATION OF WOUND VAC;  Surgeon: Kerin PernaPeter [name], MD;  [facility];  Service: Thoracic;  Laterality: Left;  . Video assisted thoracoscopy (vats)/empyema Left 08/06/2013    Procedure: VIDEO ASSISTED THORACOSCOPY (VATS)/EMPYEMA;  Surgeon: Purcell Nailslarence H Owen, MD;  [facility];  Service: Thoracic;  Laterality: Left;    No family history on file.  Social History History  Substance Use Topics  . Smoking status: Never Smoker   . Smokeless tobacco: Never Used  . Alcohol Use: No    No current outpatient prescriptions on file.   No current facility-administered medications for this visit.    No Known Allergies  Review of Systems improved strength appetite. Not packing the wound any more because it is very superficial and has closed BP 121/72  Pulse 82  Resp 16  Ht 5\' 10"  (1.778 m)  Wt 142 lb (64.411 kg)  BMI 20.37 kg/m2  SpO2 99% Physical Exam Alert and comfortable Breath sounds clear equally Heart rate regular Left anterior chest wall wound healed-Neosporin Band-Aid dressing applied Left VATS incision is  well-healed  Diagnostic Tests: Last chest x-ray reviewed and is clear  Impression: Resolved abscess left anterior chest--. Streptococcus--probably related to old tattooing. Plan: Patient will have no restrictions after may the first. He was advised not to have any further tattoos. Return when necessary

## 2014-03-15 IMAGING — CR DG CHEST 2V
2 series · 2 of 2 positions shown · non-contrast
Comparison: DG CHEST 2 VIEW dated 08/12/2013

CLINICAL DATA: Left truncal cellulitis and abscess

EXAM:
CHEST  2 VIEW

[w chest pa]
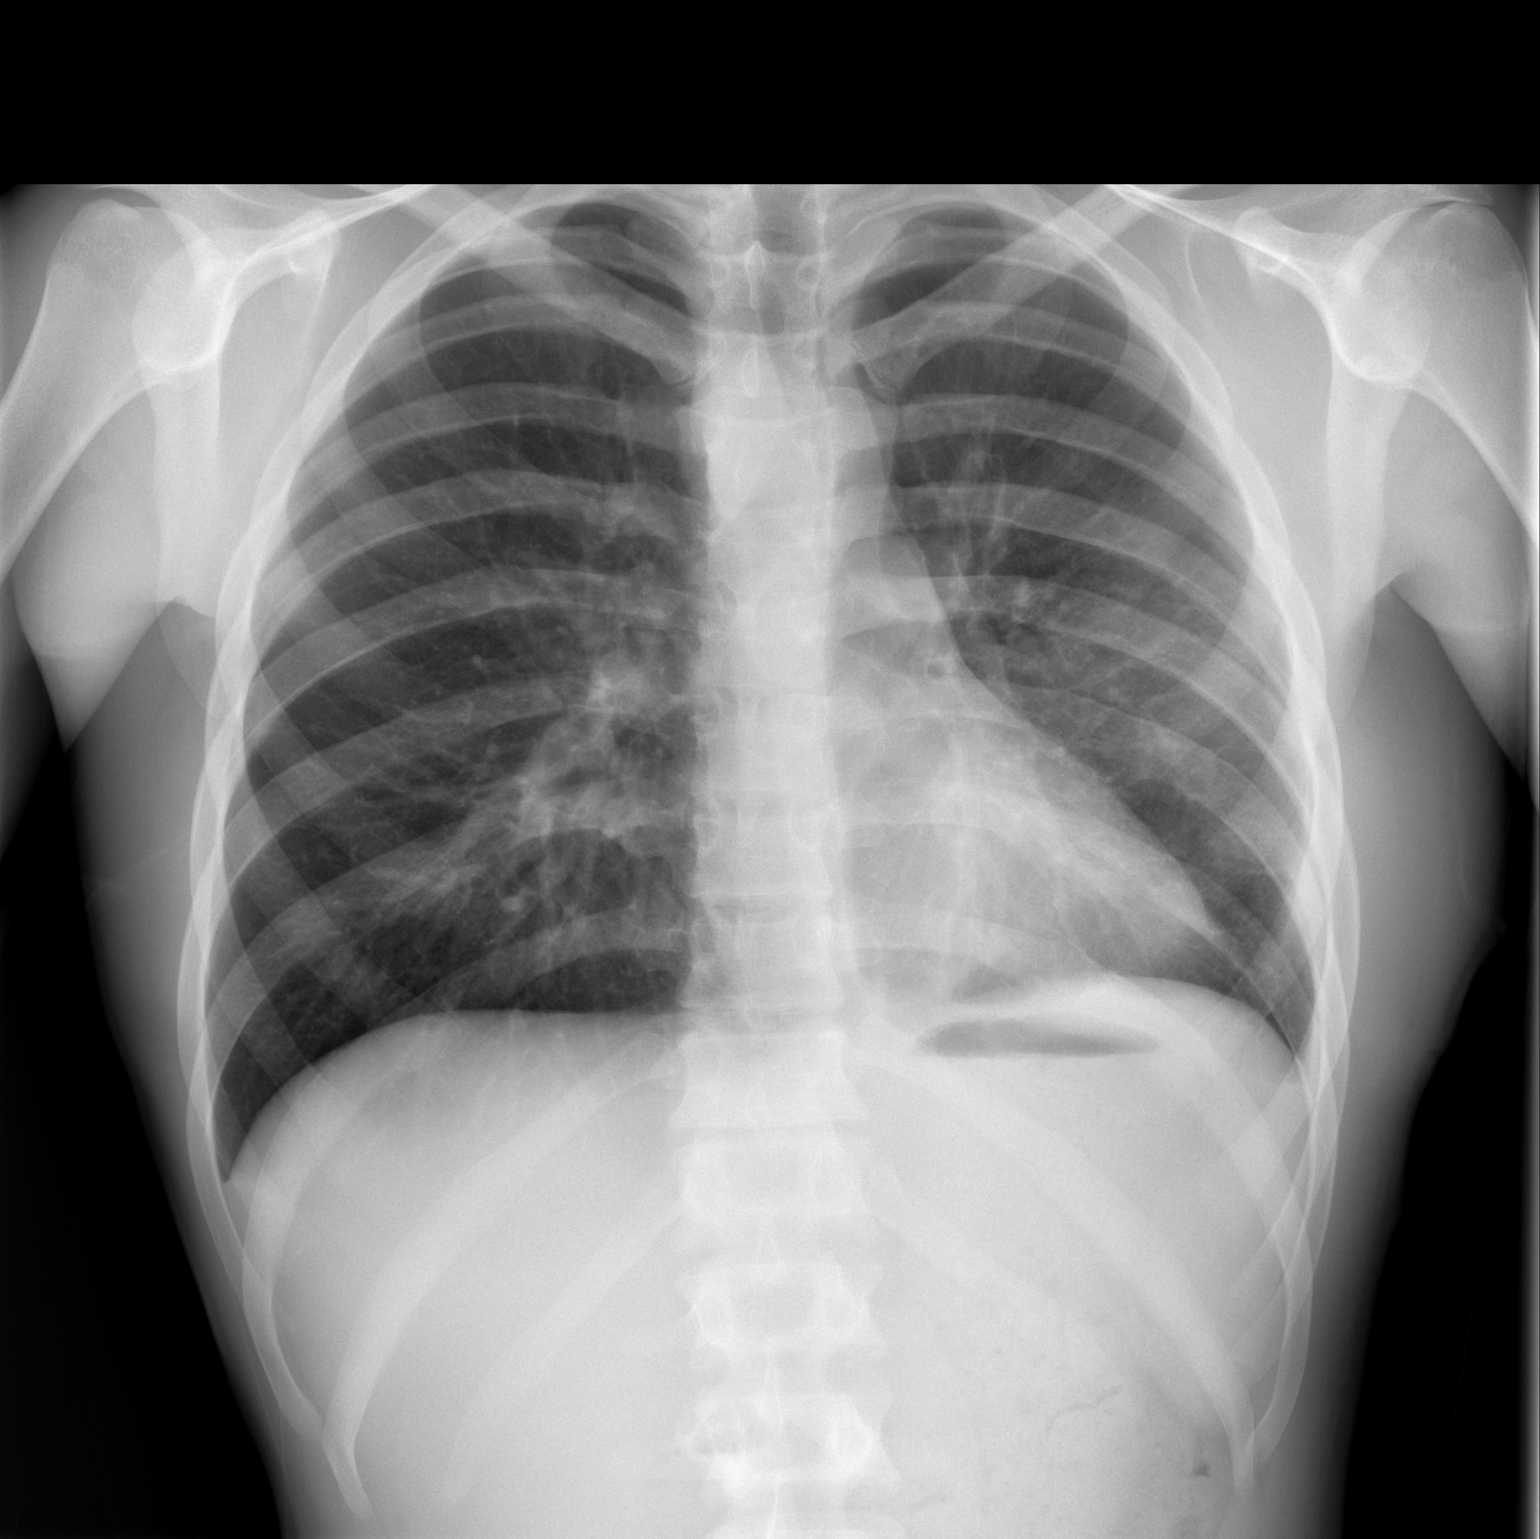

[w chest lat]
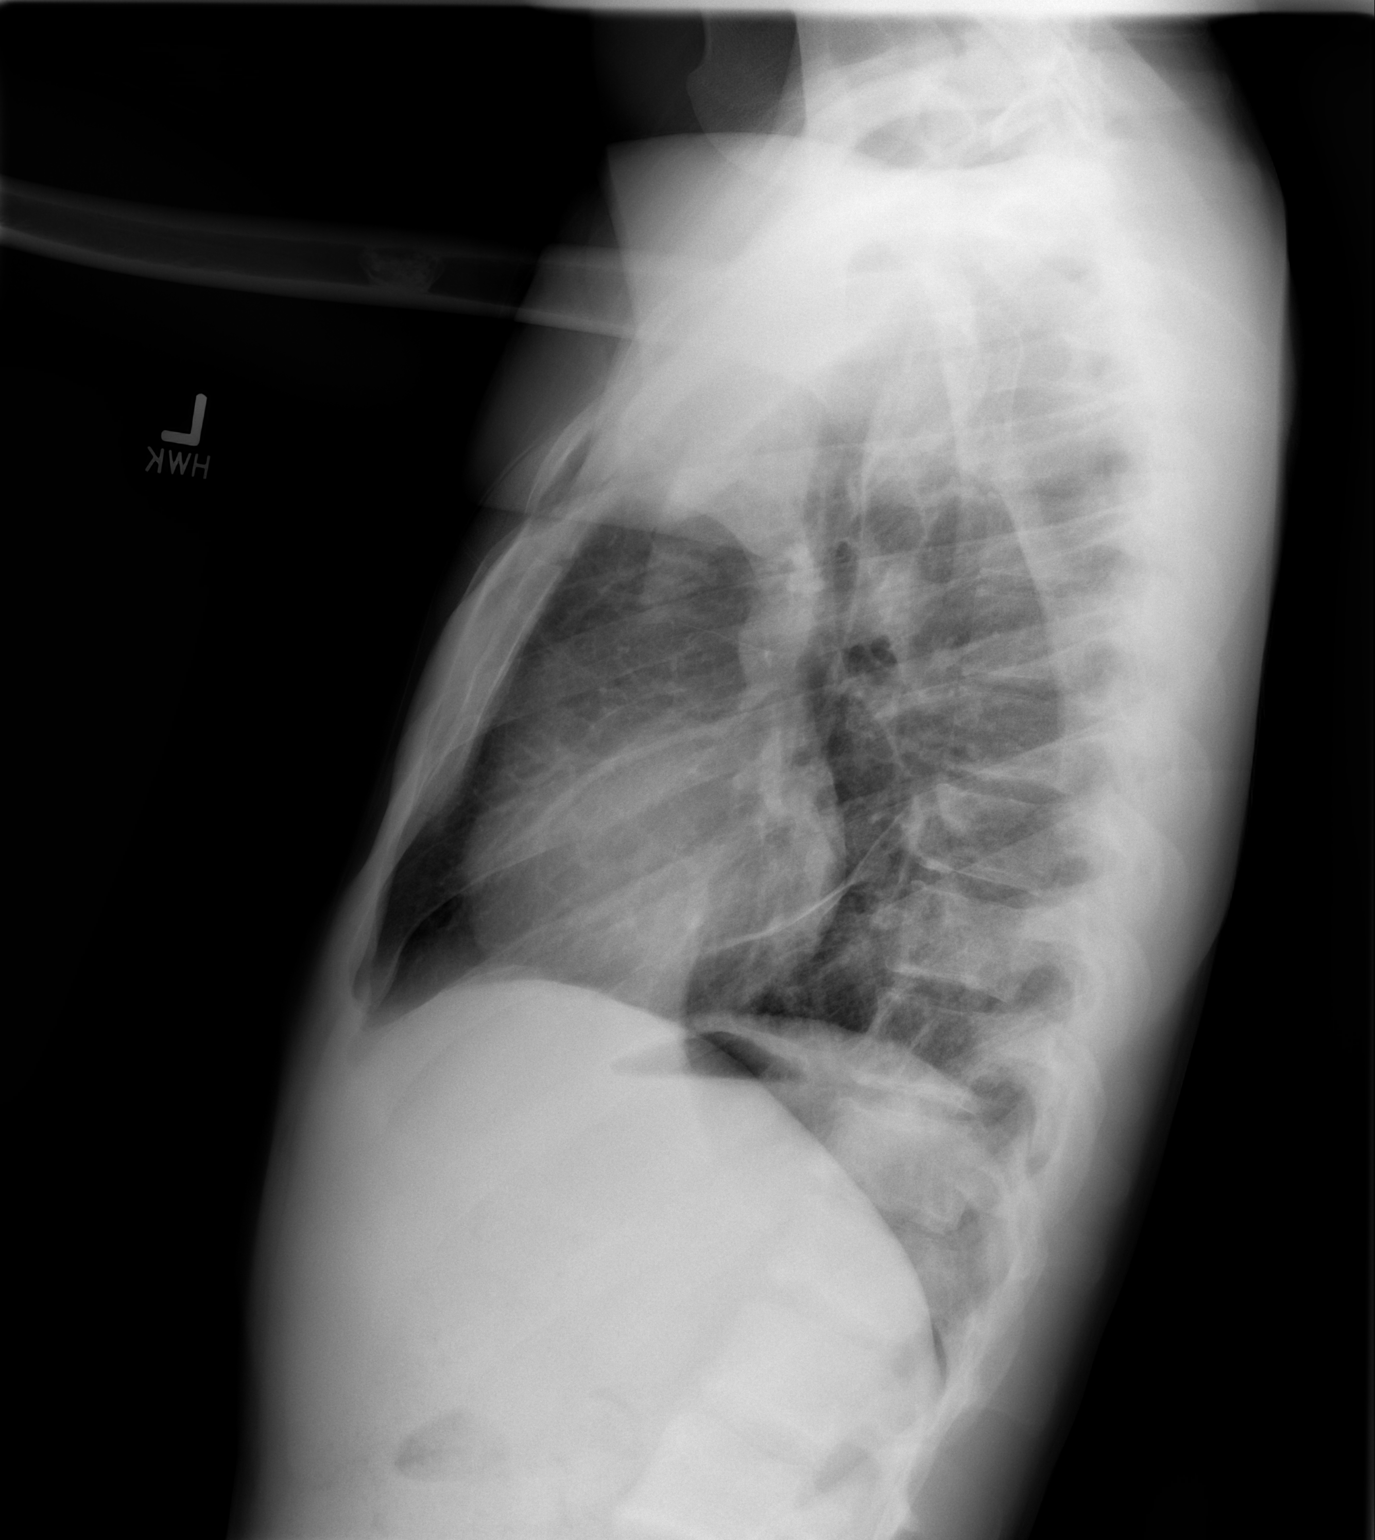

[2 of 2 positions shown; findings below may reference images not displayed]

FINDINGS: The lungs are adequately inflated. There are coarse infrahilar lung
markings on the left. There is no alveolar pneumonia. There is no
pneumothorax. A tiny amount of pleural fluid on the left is
suspected. The cardiac silhouette is normal in size. The mediastinum
is normal in width. The pulmonary vascularity is not engorged. The
bony thorax appears normal. There is soft tissue fullness in the
left axillary region. No soft tissue gas is demonstrated.
IMPRESSION: 1. Coarse infrahilar lung markings on the left likely reflect
atelectasis. There is no alveolar pneumonia. A tiny amount of
pleural fluid on the left is suspected.
2. There is no evidence of CHF.
3. There is soft tissue swelling in the left axillary region which
has decreased somewhat in conspicuity since the previous study. No
soft tissue gas is demonstrated here today.

## 2022-03-08 ENCOUNTER — Ambulatory Visit
Admission: EM | Admit: 2022-03-08 | Discharge: 2022-03-08 | Disposition: A | Discharge status: Home / Self Care | Payer: Commercial Managed Care - PPO

## 2022-03-08 ENCOUNTER — Telehealth: Payer: Self-pay | Admitting: Emergency Medicine

## 2022-03-08 DIAGNOSIS — R21 Rash and other nonspecific skin eruption: Secondary | ICD-10-CM

## 2022-03-08 MED ORDER — VALACYCLOVIR HCL 1 G PO TABS: 1000.0000 mg | ORAL_TABLET | Freq: Three times a day (TID) | ORAL | 0 refills | Status: DC

## 2022-03-08 MED ORDER — VALACYCLOVIR HCL 1 G PO TABS: 1000.0000 mg | ORAL_TABLET | Freq: Three times a day (TID) | ORAL | 0 refills | Status: AC

## 2022-03-08 NOTE — ED Provider Notes (Signed)
[facility]    CSN: [id number] Arrival date & time: 03/08/22  4315      History   Chief Complaint Chief Complaint  Patient presents with   Rash    HPI [name] [name] is a 31 y.o. male.   The history is provided by the patient and a significant other.   Patient presents for complaints of rash to the left middle back that is been present for the past 24 hours.  Patient states that prior to his symptoms starting he had an abnormal sensation that ran across the front of his chest.  Subsequently thereafter, he noticed the rash when he was walking past the mirror.  He denies fever, chills, itching, pain, oozing, or drainage.  Patient reports a prior history of chickenpox.  He has been taking Benadryl for his symptoms.  Denies any known sick contacts.  Past Medical History:  Diagnosis Date   Medical history non-contributory     Patient Active Problem List   Diagnosis Date Noted   Empyema, left (HCC) 08/06/2013   Sepsis due to Streptococcus, group A (HCC) 08/06/2013   Sepsis (HCC) 08/06/2013   ARDS (adult respiratory distress syndrome) (HCC) 08/05/2013   Acute respiratory failure with hypoxia (HCC) 08/05/2013   Protein-calorie malnutrition, severe (HCC) 08/05/2013   Septic shock (HCC) 08/04/2013   Chest wall abscess 08/03/2013   Pain 08/03/2013    Past Surgical History:  Procedure Laterality Date   APPLICATION OF WOUND VAC Left 08/04/2013   Procedure: 2 Ply APPLICATION OF WOUND VAC;  Surgeon: Kerin Perna, MD;  Location: [facility];  Service: Thoracic;  Laterality: Left;   NO PAST SURGERIES     STERNAL WOUND DEBRIDEMENT N/A 08/04/2013   Procedure: CHEST WALL ABCESS DEBRIDEMENT;  Surgeon: Kerin Perna, MD;  Location: [facility];  Service: Thoracic;  Laterality: N/A;   VIDEO ASSISTED THORACOSCOPY (VATS)/EMPYEMA Left 08/06/2013   Procedure: VIDEO ASSISTED THORACOSCOPY (VATS)/EMPYEMA;  Surgeon: Purcell Nails, MD;  Location: [facility];  Service: Thoracic;  Laterality: Left;        Home Medications    Prior to Admission medications   Medication Sig Start Date End Date Taking? Authorizing Provider  diphenhydrAMINE (BENADRYL) 50 MG capsule Take 50 mg by mouth every 6 (six) hours as needed.   Yes [provider]  valACYclovir (VALTREX) 1000 MG tablet Take 1 tablet (1,000 mg total) by mouth 3 (three) times daily for 7 days. 03/08/22 03/15/22 Yes Nayquan Evinger-Warren, Sadie Haber, NP    Family History History reviewed. No pertinent family history.  Social History Social History   Tobacco Use   Smoking status: Never   Smokeless tobacco: Never  Substance Use Topics   Alcohol use: No   Drug use: No     Allergies   Patient has no known allergies.   Review of Systems Review of Systems Per HPI  Physical Exam Triage Vital Signs ED Triage Vitals  Enc Vitals Group     BP 03/08/22 1123 (!) 144/77     Pulse Rate 03/08/22 1123 98     Resp 03/08/22 1123 16     Temp 03/08/22 1123 98 F (36.7 C)     Temp Source 03/08/22 1123 Oral     SpO2 03/08/22 1123 98 %     Weight --      Height --      Head Circumference --      Peak Flow --      Pain Score 03/08/22 1125 0  Pain Loc --      Pain Edu? --      Excl. in GC? --    No data found.  Updated Vital Signs BP (!) 144/77 (BP Location: Right Arm)   Pulse 98   Temp 98 F (36.7 C) (Oral)   Resp 16   SpO2 98%   Visual Acuity Right Eye Distance:   Left Eye Distance:   Bilateral Distance:    Right Eye Near:   Left Eye Near:    Bilateral Near:     Physical Exam Vitals and nursing note reviewed.  Constitutional:      General: He is not in acute distress.    Appearance: Normal appearance.  HENT:     Head: Normocephalic.     Mouth/Throat:     Mouth: Mucous membranes are moist.  Eyes:     Extraocular Movements: Extraocular movements intact.     Conjunctiva/sclera: Conjunctivae normal.     Pupils: Pupils are equal, round, and reactive to light.  Cardiovascular:     Rate and Rhythm:  Normal rate and regular rhythm.     Pulses: Normal pulses.     Heart sounds: Normal heart sounds.  Pulmonary:     Effort: Pulmonary effort is normal.     Breath sounds: Normal breath sounds.  Abdominal:     General: Bowel sounds are normal.     Palpations: Abdomen is soft.  Musculoskeletal:     Cervical back: Normal range of motion.  Lymphadenopathy:     Cervical: No cervical adenopathy.  Skin:    General: Skin is warm and dry.     Findings: Rash present.     Comments:  Erythematous maculopapular rash present to the left mid back.  There is no oozing, blistering, vesicles, or drainage present at this time.  Rash is only on the left side.  Neurological:     General: No focal deficit present.     Mental Status: He is alert and oriented to person, place, and time.  Psychiatric:        Mood and Affect: Mood normal.        Behavior: Behavior normal.      UC Treatments / Results  Labs (all labs ordered are listed, but only abnormal results are displayed) Labs Reviewed - No data to display  EKG   Radiology No results found.  Procedures Procedures (including critical care time)  Medications Ordered in UC Medications - No data to display  Initial Impression / Assessment and Plan / UC Course  I have reviewed the triage vital signs and the nursing notes.  Pertinent labs & imaging results that were available during my care of the patient were reviewed by me and considered in my medical decision making (see chart for details).  Patient presents with rash has been present to the left mid back x1 day.  Rash is maculopapular in presentation and in a cluster located to the left mid back.  Rash is unilateral.  Based on the presentation, symptoms are consistent with shingles.  Patient was prescribed valacyclovir for his symptoms.  Supportive care recommendations were provided to the patient.  Patient advised to follow-up with his primary care physician if symptoms fail to  improve. Final Clinical Impressions(s) / UC Diagnoses   Final diagnoses:  Rash and nonspecific skin eruption     Discharge Instructions      Take medication as prescribed. May continue over-the-counter Zyrtec or Benadryl to help with itching. Avoid hot baths or  showers while symptoms persist.  Recommend taking lukewarm baths. May apply cool cloths to the area to help with itching or discomfort. Avoid scratching, rubbing, or manipulating the areas while symptoms persist. Recommend Aveeno colloidal oatmeal bath to use to help with drying and itching. As discussed, if the rash starts to ooze or drain, this is when you are most contagious.  Keep the area covered when you are out in public or around others. Follow up with your primary care physician if symptoms do not improve.      ED Prescriptions     Medication Sig Dispense Auth. Provider   valACYclovir (VALTREX) 1000 MG tablet Take 1 tablet (1,000 mg total) by mouth 3 (three) times daily for 7 days. 21 tablet Verla Bryngelson-Warren, Sadie Haber, NP      PDMP not reviewed this encounter.   Abran Cantor, NP 03/08/22 1147

## 2022-03-08 NOTE — ED Triage Notes (Signed)
Pt reports rash in back x 1 day. Pt is taking Benadryl.

## 2022-03-08 NOTE — Discharge Instructions (Signed)
Take medication as prescribed. May continue over-the-counter Zyrtec or Benadryl to help with itching. Avoid hot baths or showers while symptoms persist.  Recommend taking lukewarm baths. May apply cool cloths to the area to help with itching or discomfort. Avoid scratching, rubbing, or manipulating the areas while symptoms persist. Recommend Aveeno colloidal oatmeal bath to use to help with drying and itching. As discussed, if the rash starts to ooze or drain, this is when you are most contagious.  Keep the area covered when you are out in public or around others. Follow up with your primary care physician if symptoms do not improve.

## 2022-03-08 NOTE — Telephone Encounter (Signed)
Patient called and requested meds be sent to [facility] due to [facility] in [place] is closed

## 2022-07-01 ENCOUNTER — Ambulatory Visit
Admission: EM | Admit: 2022-07-01 | Discharge: 2022-07-01 | Disposition: A | Discharge status: Home / Self Care | Payer: Commercial Managed Care - PPO | Attending: Nurse Practitioner | Admitting: Nurse Practitioner

## 2022-07-01 ENCOUNTER — Other Ambulatory Visit: Payer: Self-pay

## 2022-07-01 ENCOUNTER — Encounter: Payer: Self-pay | Admitting: Emergency Medicine

## 2022-07-01 DIAGNOSIS — J069 Acute upper respiratory infection, unspecified: Secondary | ICD-10-CM | POA: Insufficient documentation

## 2022-07-01 DIAGNOSIS — Z1152 Encounter for screening for COVID-19: Secondary | ICD-10-CM | POA: Diagnosis not present

## 2022-07-01 LAB — RESP PANEL BY RT-PCR (FLU A&B, COVID) ARPGX2
Influenza A by PCR: NEGATIVE
Influenza B by PCR: NEGATIVE
SARS Coronavirus 2 by RT PCR: NEGATIVE

## 2022-07-01 MED ORDER — PROMETHAZINE-DM 6.25-15 MG/5ML PO SYRP: 5.0000 mL | ORAL_SOLUTION | Freq: Every evening | ORAL | 0 refills | Status: DC | PRN

## 2022-07-01 MED ORDER — BENZONATATE 100 MG PO CAPS: 100.0000 mg | ORAL_CAPSULE | Freq: Three times a day (TID) | ORAL | 0 refills | Status: DC | PRN

## 2022-07-01 NOTE — ED Triage Notes (Signed)
Pt reports cough, head/nasal congestion, chills, diarrhea since Saturday. Pt reports has tried otc medication with no improvement of symptoms. Pt significant other has similar symptoms.

## 2022-07-01 NOTE — Discharge Instructions (Signed)
You have a viral upper respiratory infection.  Symptoms should improve over the next week to 10 days.  If you develop chest pain or shortness of breath, go to the emergency room.  We have tested you today for COVID-19 and influenza.  You will see the results in Mychart and we will call you with positive results.    Please stay home and isolate until you are aware of the results.    Some things that can make you feel better are: - Increased rest - Increasing fluid with water/sugar free electrolytes - Acetaminophen and ibuprofen as needed for fever/pain - Salt water gargling, chloraseptic spray and throat lozenges for sore throat - OTC guaifenesin (Mucinex) 600 mg twice daily to help thin congestion - Saline sinus flushes or a neti pot for nasal congestion - Humidifying the air -Tessalon Perles during the day as needed for dry cough and cough syrup at nighttime as needed for dry cough

## 2022-07-01 NOTE — ED Provider Notes (Signed)
[facility]    CSN: [id number] Arrival date & time: 07/01/22  5449      History   Chief Complaint Chief Complaint  Patient presents with   Cough    HPI [name] [name] is a 31 y.o. male.   Patient presents for 4-day history of feeling warm, body aches, congested cough, chest pain after coughing, chest and nasal congestion, sneezing, sore throat from coughing, headache, bilateral ear pressure, abdominal pain, nausea without vomiting, diarrhea, fatigue, and decreased appetite.  He denies shortness of breath, vomiting, loss of taste or smell, any rash.  Reports his fiance sick with similar symptoms.  Has been taking Tylenol Sinus, DayQuil/NyQuil, and Mucinex DM without much benefit.  He reports he is getting married this weekend.    Past Medical History:  Diagnosis Date   Medical history non-contributory     Patient Active Problem List   Diagnosis Date Noted   Empyema, left (HCC) 08/06/2013   Sepsis due to Streptococcus, group A (HCC) 08/06/2013   Sepsis (HCC) 08/06/2013   ARDS (adult respiratory distress syndrome) (HCC) 08/05/2013   Acute respiratory failure with hypoxia (HCC) 08/05/2013   Protein-calorie malnutrition, severe (HCC) 08/05/2013   Septic shock (HCC) 08/04/2013   Chest wall abscess 08/03/2013   Pain 08/03/2013    Past Surgical History:  Procedure Laterality Date   APPLICATION OF WOUND VAC Left 08/04/2013   Procedure: 2 Ply APPLICATION OF WOUND VAC;  Surgeon: Kerin Perna, MD;  Location: [facility];  Service: Thoracic;  Laterality: Left;   NO PAST SURGERIES     STERNAL WOUND DEBRIDEMENT N/A 08/04/2013   Procedure: CHEST WALL ABCESS DEBRIDEMENT;  Surgeon: Kerin Perna, MD;  Location: [facility];  Service: Thoracic;  Laterality: N/A;   VIDEO ASSISTED THORACOSCOPY (VATS)/EMPYEMA Left 08/06/2013   Procedure: VIDEO ASSISTED THORACOSCOPY (VATS)/EMPYEMA;  Surgeon: Purcell Nails, MD;  Location: [facility];  Service: Thoracic;  Laterality: Left;       Home  Medications    Prior to Admission medications   Medication Sig Start Date End Date Taking? Authorizing Provider  benzonatate (TESSALON) 100 MG capsule Take 1 capsule (100 mg total) by mouth 3 (three) times daily as needed for cough. Do not take with alcohol or while driving or operating heavy machinery.  May cause drowsiness. 07/01/22  Yes Valentino Nose, NP  promethazine-dextromethorphan (PROMETHAZINE-DM) 6.25-15 MG/5ML syrup Take 5 mLs by mouth at bedtime as needed for cough. Do not take with alcohol or while driving or operating heavy machinery.  May cause drowsiness. 07/01/22  Yes Valentino Nose, NP    Family History History reviewed. No pertinent family history.  Social History Social History   Tobacco Use   Smoking status: Never   Smokeless tobacco: Never  Substance Use Topics   Alcohol use: No   Drug use: No     Allergies   Patient has no known allergies.   Review of Systems Review of Systems Per HPI  Physical Exam Triage Vital Signs ED Triage Vitals [07/01/22 1058]  Enc Vitals Group     BP (!) 143/83     Pulse Rate 86     Resp 20     Temp 98.2 F (36.8 C)     Temp Source Oral     SpO2 97 %     Weight      Height      Head Circumference      Peak Flow      Pain Score 4  Pain Loc      Pain Edu?      Excl. in GC?    No data found.  Updated Vital Signs BP (!) 143/83 (BP Location: Right Arm)   Pulse 86   Temp 98.2 F (36.8 C) (Oral)   Resp 20   SpO2 97%   Visual Acuity Right Eye Distance:   Left Eye Distance:   Bilateral Distance:    Right Eye Near:   Left Eye Near:    Bilateral Near:     Physical Exam Vitals and nursing note reviewed.  Constitutional:      General: He is not in acute distress.    Appearance: Normal appearance. He is not ill-appearing or toxic-appearing.  HENT:     Head: Normocephalic and atraumatic.     Right Ear: Tympanic membrane, ear canal and external ear normal.     Left Ear: Tympanic membrane, ear  canal and external ear normal.     Nose: Congestion and rhinorrhea present.     Mouth/Throat:     Mouth: Mucous membranes are moist.     Pharynx: Oropharynx is clear. Posterior oropharyngeal erythema present. No oropharyngeal exudate.  Eyes:     General: No scleral icterus.    Extraocular Movements: Extraocular movements intact.  Cardiovascular:     Rate and Rhythm: Normal rate and regular rhythm.  Pulmonary:     Effort: Pulmonary effort is normal. No respiratory distress.     Breath sounds: Normal breath sounds. No wheezing, rhonchi or rales.  Abdominal:     General: Abdomen is flat. Bowel sounds are normal. There is no distension.     Palpations: Abdomen is soft.  Musculoskeletal:     Cervical back: Normal range of motion and neck supple. No rigidity.  Lymphadenopathy:     Cervical: No cervical adenopathy.  Skin:    General: Skin is warm and dry.     Coloration: Skin is not jaundiced or pale.     Findings: No erythema or rash.  Neurological:     Mental Status: He is alert and oriented to person, place, and time.  Psychiatric:        Behavior: Behavior is cooperative.      UC Treatments / Results  Labs (all labs ordered are listed, but only abnormal results are displayed) Labs Reviewed  RESP PANEL BY RT-PCR (FLU A&B, COVID) [id number]    EKG   Radiology No results found.  Procedures Procedures (including critical care time)  Medications Ordered in UC Medications - No data to display  Initial Impression / Assessment and Plan / UC Course  I have reviewed the triage vital signs and the nursing notes.  Pertinent labs & imaging results that were available during my care of the patient were reviewed by me and considered in my medical decision making (see chart for details).   Patient is well-appearing, afebrile, not tachycardic, not tachypneic, oxygenating well on room air.  He is mildly hypertensive today, likely secondary to acute illness and possible side effect of  over-the-counter medication use.  Encounter for screening for COVID-19 Viral URI with cough Suspect viral etiology COVID-19, influenza testing obtained Supportive care discussed-start cough suppressants ER and return precautions discussed with patient Note given for work  The patient was given the opportunity to ask questions.  All questions answered to their satisfaction.  The patient is in agreement to this plan.    Final Clinical Impressions(s) / UC Diagnoses   Final diagnoses:  Encounter for screening for  COVID-19  Viral URI with cough     Discharge Instructions      You have a viral upper respiratory infection.  Symptoms should improve over the next week to 10 days.  If you develop chest pain or shortness of breath, go to the emergency room.  We have tested you today for COVID-19 and influenza.  You will see the results in Mychart and we will call you with positive results.    Please stay home and isolate until you are aware of the results.    Some things that can make you feel better are: - Increased rest - Increasing fluid with water/sugar free electrolytes - Acetaminophen and ibuprofen as needed for fever/pain - Salt water gargling, chloraseptic spray and throat lozenges for sore throat - OTC guaifenesin (Mucinex) 600 mg twice daily to help thin congestion - Saline sinus flushes or a neti pot for nasal congestion - Humidifying the air -Tessalon Perles during the day as needed for dry cough and cough syrup at nighttime as needed for dry cough     ED Prescriptions     Medication Sig Dispense Auth. Provider   promethazine-dextromethorphan (PROMETHAZINE-DM) 6.25-15 MG/5ML syrup Take 5 mLs by mouth at bedtime as needed for cough. Do not take with alcohol or while driving or operating heavy machinery.  May cause drowsiness. 118 mL Cathlean Marseilles A, NP   benzonatate (TESSALON) 100 MG capsule Take 1 capsule (100 mg total) by mouth 3 (three) times daily as needed for  cough. Do not take with alcohol or while driving or operating heavy machinery.  May cause drowsiness. 21 capsule Valentino Nose, NP      PDMP not reviewed this encounter.   Valentino Nose, NP 07/01/22 1152

## 2022-08-19 ENCOUNTER — Encounter: Payer: Self-pay | Admitting: Emergency Medicine

## 2022-08-19 ENCOUNTER — Ambulatory Visit
Admission: EM | Admit: 2022-08-19 | Discharge: 2022-08-19 | Disposition: A | Discharge status: Home / Self Care | Payer: Commercial Managed Care - PPO | Attending: Family Medicine | Admitting: Family Medicine

## 2022-08-19 ENCOUNTER — Other Ambulatory Visit: Payer: Self-pay

## 2022-08-19 DIAGNOSIS — R112 Nausea with vomiting, unspecified: Secondary | ICD-10-CM | POA: Diagnosis not present

## 2022-08-19 DIAGNOSIS — Z1152 Encounter for screening for COVID-19: Secondary | ICD-10-CM | POA: Diagnosis not present

## 2022-08-19 DIAGNOSIS — J069 Acute upper respiratory infection, unspecified: Secondary | ICD-10-CM | POA: Diagnosis not present

## 2022-08-19 MED ORDER — PROMETHAZINE-DM 6.25-15 MG/5ML PO SYRP: 5.0000 mL | ORAL_SOLUTION | Freq: Every evening | ORAL | 0 refills | Status: DC | PRN

## 2022-08-19 MED ORDER — ONDANSETRON 4 MG PO TBDP: 4.0000 mg | ORAL_TABLET | Freq: Three times a day (TID) | ORAL | 0 refills | Status: DC | PRN

## 2022-08-19 NOTE — ED Provider Notes (Signed)
[facility]    CSN: [id number] Arrival date & time: 08/19/22  0809      History   Chief Complaint Chief Complaint  Patient presents with   Fever    HPI [name] [name] is a 32 y.o. male.   Patient presenting today with 2-day history of fever, chills, body aches, congestion, cough, nausea, vomiting.  Denies chest pain, shortness of breath, abdominal pain, nausea vomiting or diarrhea.  So far taking over-the-counter remedies with minimal relief.  Wife sick with similar symptoms.    Past Medical History:  Diagnosis Date   Medical history non-contributory     Patient Active Problem List   Diagnosis Date Noted   Empyema, left ([place]) 08/06/2013   Sepsis due to Streptococcus, group A ([place]) 08/06/2013   Sepsis ([place]) 08/06/2013   ARDS (adult respiratory distress syndrome) ([place]) 08/05/2013   Acute respiratory failure with hypoxia ([place]) 08/05/2013   Protein-calorie malnutrition, severe ([place]) 08/05/2013   Septic shock ([place]) 08/04/2013   Chest wall abscess 08/03/2013   Pain 08/03/2013    Past Surgical History:  Procedure Laterality Date   APPLICATION OF WOUND VAC Left 08/04/2013   Procedure: 2 Ply APPLICATION OF WOUND VAC;  Surgeon: Ivin Poot, MD;  Location: [place];  Service: Thoracic;  Laterality: Left;   NO PAST SURGERIES     STERNAL WOUND DEBRIDEMENT N/A 08/04/2013   Procedure: CHEST WALL ABCESS DEBRIDEMENT;  Surgeon: Ivin Poot, MD;  Location: [place];  Service: Thoracic;  Laterality: N/A;   VIDEO ASSISTED THORACOSCOPY (VATS)/EMPYEMA Left 08/06/2013   Procedure: VIDEO ASSISTED THORACOSCOPY (VATS)/EMPYEMA;  Surgeon: Rexene Alberts, MD;  Location: [place];  Service: Thoracic;  Laterality: Left;       Home Medications    Prior to Admission medications   Medication Sig Start Date End Date Taking? Authorizing Provider  ondansetron (ZOFRAN-ODT) 4 MG disintegrating tablet Take 1 tablet (4 mg total) by mouth every 8 (eight) hours as needed for nausea or vomiting.  08/19/22  Yes Volney American, PA-C  benzonatate (TESSALON) 100 MG capsule Take 1 capsule (100 mg total) by mouth 3 (three) times daily as needed for cough. Do not take with alcohol or while driving or operating heavy machinery.  May cause drowsiness. 07/01/22   Eulogio Bear, NP  promethazine-dextromethorphan (PROMETHAZINE-DM) 6.25-15 MG/5ML syrup Take 5 mLs by mouth at bedtime as needed for cough. Do not take with alcohol or while driving or operating heavy machinery.  May cause drowsiness. 08/19/22   Volney American, PA-C    Family History History reviewed. No pertinent family history.  Social History Social History   Tobacco Use   Smoking status: Never   Smokeless tobacco: Never  Substance Use Topics   Alcohol use: No   Drug use: No     Allergies   Patient has no known allergies.   Review of Systems Review of Systems PER HPI  Physical Exam Triage Vital Signs ED Triage Vitals  Enc Vitals Group     BP 08/19/22 0900 126/80     Pulse Rate 08/19/22 0900 76     Resp 08/19/22 0900 16     Temp 08/19/22 0900 98.3 F (36.8 C)     Temp Source 08/19/22 0900 Oral     SpO2 08/19/22 0900 98 %     Weight --      Height --      Head Circumference --      Peak Flow --  Pain Score 08/19/22 0902 4     Pain Loc --      Pain Edu? --      Excl. in [place]? --    No data found.  Updated Vital Signs BP 126/80 (BP Location: Right Arm)   Pulse 76   Temp 98.3 F (36.8 C) (Oral)   Resp 16   SpO2 98%   Visual Acuity Right Eye Distance:   Left Eye Distance:   Bilateral Distance:    Right Eye Near:   Left Eye Near:    Bilateral Near:     Physical Exam Vitals and nursing note reviewed.  Constitutional:      Appearance: He is well-developed.  HENT:     Head: Atraumatic.     Right Ear: External ear normal.     Left Ear: External ear normal.     Nose: Rhinorrhea present.     Mouth/Throat:     Pharynx: Posterior oropharyngeal erythema present. No  oropharyngeal exudate.  Eyes:     Conjunctiva/sclera: Conjunctivae normal.     Pupils: Pupils are equal, round, and reactive to light.  Cardiovascular:     Rate and Rhythm: Normal rate and regular rhythm.  Pulmonary:     Effort: Pulmonary effort is normal. No respiratory distress.     Breath sounds: No wheezing or rales.  Musculoskeletal:        General: Normal range of motion.     Cervical back: Normal range of motion and neck supple.  Lymphadenopathy:     Cervical: No cervical adenopathy.  Skin:    General: Skin is warm and dry.  Neurological:     Mental Status: He is alert and oriented to person, place, and time.     Motor: No weakness.     Gait: Gait normal.  Psychiatric:        Mood and Affect: Mood normal.        Behavior: Behavior normal.        Thought Content: Thought content normal.        Judgment: Judgment normal.      UC Treatments / Results  Labs (all labs ordered are listed, but only abnormal results are displayed) Labs Reviewed  SARS CORONAVIRUS 2 (TAT 6-24 HRS)    EKG   Radiology No results found.  Procedures Procedures (including critical care time)  Medications Ordered in UC Medications - No data to display  Initial Impression / Assessment and Plan / UC Course  I have reviewed the triage vital signs and the nursing notes.  Pertinent labs & imaging results that were available during my care of the patient were reviewed by me and considered in my medical decision making (see chart for details).     Vitals and exam reassuring and suggestive of a viral respiratory infection with GI manifestations as well.  Treat with Phenergan DM, Zofran, over-the-counter cold congestion medications, brat diet, fluids.  COVID testing pending.  Return for worsening symptoms.  Work note given.  Final Clinical Impressions(s) / UC Diagnoses   Final diagnoses:  Viral URI with cough  Nausea and vomiting, unspecified vomiting type   Discharge Instructions    None    ED Prescriptions     Medication Sig Dispense Auth. Provider   promethazine-dextromethorphan (PROMETHAZINE-DM) 6.25-15 MG/5ML syrup Take 5 mLs by mouth at bedtime as needed for cough. Do not take with alcohol or while driving or operating heavy machinery.  May cause drowsiness. 118 mL Volney American, [place]  ondansetron (ZOFRAN-ODT) 4 MG disintegrating tablet Take 1 tablet (4 mg total) by mouth every 8 (eight) hours as needed for nausea or vomiting. 20 tablet Volney American, [place]      PDMP not reviewed this encounter.   Volney American, [place] 08/19/22 1121

## 2022-08-19 NOTE — ED Triage Notes (Signed)
Pt reports fever, emesis, body aches since Monday. Home covid test negative x2 days ago.

## 2022-08-20 LAB — SARS CORONAVIRUS 2 (TAT 6-24 HRS): SARS Coronavirus 2: NEGATIVE

## 2023-07-06 ENCOUNTER — Ambulatory Visit
Admission: EM | Admit: 2023-07-06 | Discharge: 2023-07-06 | Disposition: A | Discharge status: Home / Self Care | Payer: Commercial Managed Care - PPO | Attending: Nurse Practitioner | Admitting: Nurse Practitioner

## 2023-07-06 ENCOUNTER — Encounter: Payer: Self-pay | Admitting: Emergency Medicine

## 2023-07-06 DIAGNOSIS — J029 Acute pharyngitis, unspecified: Secondary | ICD-10-CM | POA: Insufficient documentation

## 2023-07-06 DIAGNOSIS — J069 Acute upper respiratory infection, unspecified: Secondary | ICD-10-CM | POA: Insufficient documentation

## 2023-07-06 LAB — POCT RAPID STREP A (OFFICE): Rapid Strep A Screen: NEGATIVE

## 2023-07-06 MED ORDER — NYSTATIN 100000 UNIT/ML MT SUSP: 5.0000 mL | Freq: Four times a day (QID) | OROMUCOSAL | 0 refills | Status: AC | PRN

## 2023-07-06 MED ORDER — FLUTICASONE PROPIONATE 50 MCG/ACT NA SUSP: 2.0000 | Freq: Every day | NASAL | 0 refills | Status: AC

## 2023-07-06 NOTE — ED Provider Notes (Signed)
[facility]    CSN: [id number] Arrival date & time: 07/06/23  8657      History   Chief Complaint No chief complaint on file.   HPI [name] [name] is a 32 y.o. male.   The history is provided by the patient.   Patient presents for complaints of nasal congestion, cough, and sore throat with low-grade fever.  Tmax around 100.  Symptoms started over the past 48 hours.  Patient denies chills, headache, ear pain, runny nose, wheezing, difficulty breathing, chest pain, abdominal pain, nausea, vomiting, diarrhea, or rash.  Patient reports that he recently returned from a trip from [place], and symptoms started shortly thereafter.  Reports he has been taking Sudafed and NyQuil with some relief.  Past Medical History:  Diagnosis Date   Medical history non-contributory     Patient Active Problem List   Diagnosis Date Noted   Empyema, left (HCC) 08/06/2013   Sepsis due to Streptococcus, group A (HCC) 08/06/2013   Sepsis (HCC) 08/06/2013   ARDS (adult respiratory distress syndrome) (HCC) 08/05/2013   Acute respiratory failure with hypoxia (HCC) 08/05/2013   Protein-calorie malnutrition, severe (HCC) 08/05/2013   Septic shock (HCC) 08/04/2013   Chest wall abscess 08/03/2013   Pain 08/03/2013    Past Surgical History:  Procedure Laterality Date   APPLICATION OF WOUND VAC Left 08/04/2013   Procedure: 2 Ply APPLICATION OF WOUND VAC;  Surgeon: Kerin Perna, MD;  Location: [facility] OR;  Service: Thoracic;  Laterality: Left;   NO PAST SURGERIES     STERNAL WOUND DEBRIDEMENT N/A 08/04/2013   Procedure: CHEST WALL ABCESS DEBRIDEMENT;  Surgeon: Kerin Perna, MD;  Location: [facility] OR;  Service: Thoracic;  Laterality: N/A;   VIDEO ASSISTED THORACOSCOPY (VATS)/EMPYEMA Left 08/06/2013   Procedure: VIDEO ASSISTED THORACOSCOPY (VATS)/EMPYEMA;  Surgeon: Purcell Nails, MD;  Location: [facility] OR;  Service: Thoracic;  Laterality: Left;       Home Medications    Prior to Admission medications    Not on File    Family History History reviewed. No pertinent family history.  Social History Social History   Tobacco Use   Smoking status: Never   Smokeless tobacco: Never  Substance Use Topics   Alcohol use: No   Drug use: No     Allergies   Patient has no known allergies.   Review of Systems Review of Systems Per HPI  Physical Exam Triage Vital Signs ED Triage Vitals  Encounter Vitals Group     BP 07/06/23 0819 123/78     Systolic BP Percentile --      Diastolic BP Percentile --      Pulse Rate 07/06/23 0819 66     Resp 07/06/23 0819 18     Temp 07/06/23 0819 98.5 F (36.9 C)     Temp Source 07/06/23 0819 Oral     SpO2 07/06/23 0819 97 %     Weight --      Height --      Head Circumference --      Peak Flow --      Pain Score 07/06/23 0821 6     Pain Loc --      Pain Education --      Exclude from Growth Chart --    No data found.  Updated Vital Signs BP 123/78 (BP Location: Right Arm)   Pulse 66   Temp 98.5 F (36.9 C) (Oral)   Resp 18   SpO2 97%   Visual  Acuity Right Eye Distance:   Left Eye Distance:   Bilateral Distance:    Right Eye Near:   Left Eye Near:    Bilateral Near:     Physical Exam Vitals and nursing note reviewed.  Constitutional:      General: He is not in acute distress.    Appearance: Normal appearance.  HENT:     Head: Normocephalic.     Right Ear: Tympanic membrane, ear canal and external ear normal.     Left Ear: Tympanic membrane, ear canal and external ear normal.     Nose: Congestion present.     Right Turbinates: Enlarged and swollen.     Left Turbinates: Enlarged and swollen.     Right Sinus: No maxillary sinus tenderness or frontal sinus tenderness.     Left Sinus: No maxillary sinus tenderness or frontal sinus tenderness.     Mouth/Throat:     Lips: Pink.     Mouth: Mucous membranes are moist.     Pharynx: Posterior oropharyngeal erythema and postnasal drip present.     Tonsils: 1+ on the right. 1+  on the left.  Eyes:     Extraocular Movements: Extraocular movements intact.     Conjunctiva/sclera: Conjunctivae normal.     Pupils: Pupils are equal, round, and reactive to light.  Cardiovascular:     Rate and Rhythm: Normal rate and regular rhythm.     Pulses: Normal pulses.     Heart sounds: Normal heart sounds.  Pulmonary:     Effort: Pulmonary effort is normal. No respiratory distress.     Breath sounds: Normal breath sounds. No stridor. No wheezing, rhonchi or rales.  Abdominal:     Palpations: Abdomen is soft.     Tenderness: There is no abdominal tenderness.  Musculoskeletal:     Cervical back: Normal range of motion.  Lymphadenopathy:     Cervical: No cervical adenopathy.  Skin:    General: Skin is warm and dry.  Neurological:     General: No focal deficit present.     Mental Status: He is alert and oriented to person, place, and time.  Psychiatric:        Mood and Affect: Mood normal.        Behavior: Behavior normal.      UC Treatments / Results  Labs (all labs ordered are listed, but only abnormal results are displayed) Labs Reviewed  CULTURE, GROUP A STREP [facility])  POCT RAPID STREP A (OFFICE)    EKG   Radiology No results found.  Procedures Procedures (including critical care time)  Medications Ordered in UC Medications - No data to display  Initial Impression / Assessment and Plan / UC Course  I have reviewed the triage vital signs and the nursing notes.  Pertinent labs & imaging results that were available during my care of the patient were reviewed by me and considered in my medical decision making (see chart for details).  On exam, lung sounds are clear throughout, room air sats at 97%.  Rapid strep test is negative, throat culture is pending.  Suspect a viral upper respiratory infection at this time.  Will provide symptomatic treatment with Magic mouthwash for patient to swish and swallow along with fluticasone 50 mcg nasal spray for nasal  congestion and postnasal drainage.  Supportive care recommendations were provided and discussed with the patient to include fluids, rest, over-the-counter analgesics, warm salt water gargles, and a soft diet.  Discussed indications with the patient regarding when  follow-up be indicated.  Patient was in agreement with this plan of care and verbalized understanding.  All questions were answered.  Patient stable for discharge.  Work note was provided.  Final Clinical Impressions(s) / UC Diagnoses   Final diagnoses:  Sore throat   Discharge Instructions   None    ED Prescriptions   None    PDMP not reviewed this encounter.   Abran Cantor, NP 07/06/23 [phone]

## 2023-07-06 NOTE — ED Triage Notes (Signed)
Nasal congestion since Sunday.  Yesterday sore throat started and fever started last night.  Has been taking sudafed and nyquil with some relief.

## 2023-07-06 NOTE — Discharge Instructions (Addendum)
Rapid strep test is negative, throat culture is pending.  You will be contacted if the pending test result is abnormal.  You also have access to the results via MyChart. Take medication as prescribed. Increase fluids and allow for plenty of rest. Recommend Tylenol or ibuprofen as needed for pain, fever, or general discomfort. Recommend throat lozenges, Chloraseptic or honey to help with throat pain. Warm salt water gargles 3-4 times daily to help with throat pain or discomfort. Recommend a diet with soft foods to include soups, broths, puddings, yogurt, Jell-O's, or popsicles until symptoms improve. Follow-up if symptoms do not improve.

## 2023-07-09 LAB — CULTURE, GROUP A STREP (THRC)

## 2023-07-15 ENCOUNTER — Ambulatory Visit
Admission: EM | Admit: 2023-07-15 | Discharge: 2023-07-15 | Disposition: A | Discharge status: Home / Self Care | Payer: Commercial Managed Care - PPO | Attending: Nurse Practitioner | Admitting: Nurse Practitioner

## 2023-07-15 ENCOUNTER — Other Ambulatory Visit: Payer: Self-pay

## 2023-07-15 ENCOUNTER — Encounter: Payer: Self-pay | Admitting: Emergency Medicine

## 2023-07-15 DIAGNOSIS — J069 Acute upper respiratory infection, unspecified: Secondary | ICD-10-CM | POA: Diagnosis not present

## 2023-07-15 MED ORDER — PREDNISONE 20 MG PO TABS: 40.0000 mg | ORAL_TABLET | Freq: Every day | ORAL | 0 refills | Status: AC

## 2023-07-15 MED ORDER — PSEUDOEPH-BROMPHEN-DM 30-2-10 MG/5ML PO SYRP: 5.0000 mL | ORAL_SOLUTION | Freq: Four times a day (QID) | ORAL | 0 refills | Status: AC | PRN

## 2023-07-15 NOTE — ED Provider Notes (Signed)
[facility]    CSN: [id number] Arrival date & time: 07/15/23  9563      History   Chief Complaint Chief Complaint  Patient presents with   Nasal Congestion    HPI [name] [name] is a 32 y.o. male.   The history is provided by the patient and the spouse.   Patient presents for follow-up for complaints of nasal congestion and productive cough that occurs at night.  Patient denies fever, chills, headache, ear pain, wheezing, difficulty breathing, chest pain, abdominal pain, nausea, vomiting, diarrhea, or rash.  Patient states cough improves throughout the day, but worsens at night.  He also states that his throat pain worsens at night.  He reports that he did not take the medications prescribed previously due to his insurance.  Reports that he did take an old prescription of promethazine last evening for his cough.  Past Medical History:  Diagnosis Date   Medical history non-contributory     Patient Active Problem List   Diagnosis Date Noted   Empyema, left (HCC) 08/06/2013   Sepsis due to Streptococcus, group A (HCC) 08/06/2013   Sepsis (HCC) 08/06/2013   ARDS (adult respiratory distress syndrome) (HCC) 08/05/2013   Acute respiratory failure with hypoxia (HCC) 08/05/2013   Protein-calorie malnutrition, severe (HCC) 08/05/2013   Septic shock (HCC) 08/04/2013   Chest wall abscess 08/03/2013   Pain 08/03/2013    Past Surgical History:  Procedure Laterality Date   APPLICATION OF WOUND VAC Left 08/04/2013   Procedure: 2 Ply APPLICATION OF WOUND VAC;  Surgeon: Kerin Perna, MD;  Location: [facility];  Service: Thoracic;  Laterality: Left;   NO PAST SURGERIES     STERNAL WOUND DEBRIDEMENT N/A 08/04/2013   Procedure: CHEST WALL ABCESS DEBRIDEMENT;  Surgeon: Kerin Perna, MD;  Location: [facility];  Service: Thoracic;  Laterality: N/A;   VIDEO ASSISTED THORACOSCOPY (VATS)/EMPYEMA Left 08/06/2013   Procedure: VIDEO ASSISTED THORACOSCOPY (VATS)/EMPYEMA;  Surgeon: Purcell Nails, MD;  Location: [facility];  Service: Thoracic;  Laterality: Left;       Home Medications    Prior to Admission medications   Medication Sig Start Date End Date Taking? Authorizing Provider  brompheniramine-pseudoephedrine-DM 30-2-10 MG/5ML syrup Take 5 mLs by mouth 4 (four) times daily as needed. 07/15/23  Yes Leath-Warren, Sadie Haber, NP  predniSONE (DELTASONE) 20 MG tablet Take 2 tablets (40 mg total) by mouth daily with breakfast for 5 days. 07/15/23 07/20/23 Yes Leath-Warren, Sadie Haber, NP  fluticasone (FLONASE) 50 MCG/ACT nasal spray Place 2 sprays into both nostrils daily. 07/06/23   Leath-Warren, Sadie Haber, NP  magic mouthwash (nystatin, hydrocortisone, diphenhydrAMINE, lidocaine) suspension Swish and swallow 5 mLs 4 (four) times daily as needed for mouth pain. 07/06/23   Leath-Warren, Sadie Haber, NP    Family History History reviewed. No pertinent family history.  Social History Social History   Tobacco Use   Smoking status: Never   Smokeless tobacco: Never  Substance Use Topics   Alcohol use: No   Drug use: No     Allergies   Patient has no known allergies.   Review of Systems Review of Systems Per HPI  Physical Exam Triage Vital Signs ED Triage Vitals  Encounter Vitals Group     BP 07/15/23 1000 119/75     Systolic BP Percentile --      Diastolic BP Percentile --      Pulse Rate 07/15/23 1000 66     Resp 07/15/23 1000 17  Temp 07/15/23 1000 98.1 F (36.7 C)     Temp Source 07/15/23 1000 Oral     SpO2 07/15/23 1000 97 %     Weight --      Height --      Head Circumference --      Peak Flow --      Pain Score 07/15/23 1002 7     Pain Loc --      Pain Education --      Exclude from Growth Chart --    No data found.  Updated Vital Signs BP 119/75 (BP Location: Right Arm)   Pulse 66   Temp 98.1 F (36.7 C) (Oral)   Resp 17   SpO2 97%   Visual Acuity Right Eye Distance:   Left Eye Distance:   Bilateral Distance:    Right Eye Near:    Left Eye Near:    Bilateral Near:     Physical Exam Vitals and nursing note reviewed.  Constitutional:      General: He is not in acute distress.    Appearance: Normal appearance.  HENT:     Head: Normocephalic.     Right Ear: Tympanic membrane, ear canal and external ear normal.     Left Ear: Tympanic membrane, ear canal and external ear normal.     Nose: Congestion present.     Right Turbinates: Enlarged and swollen.     Left Turbinates: Enlarged and swollen.     Right Sinus: No maxillary sinus tenderness or frontal sinus tenderness.     Left Sinus: No maxillary sinus tenderness or frontal sinus tenderness.     Mouth/Throat:     Lips: Pink.     Mouth: Mucous membranes are moist.     Pharynx: Oropharynx is clear. Uvula midline. Postnasal drip present. No pharyngeal swelling, oropharyngeal exudate, posterior oropharyngeal erythema or uvula swelling.     Comments: Cobblestoning present to posterior oropharynx  Eyes:     Extraocular Movements: Extraocular movements intact.     Conjunctiva/sclera: Conjunctivae normal.     Pupils: Pupils are equal, round, and reactive to light.  Cardiovascular:     Rate and Rhythm: Normal rate and regular rhythm.     Pulses: Normal pulses.     Heart sounds: Normal heart sounds.  Pulmonary:     Effort: Pulmonary effort is normal. No respiratory distress.     Breath sounds: Normal breath sounds. No stridor. No wheezing, rhonchi or rales.  Abdominal:     General: Bowel sounds are normal.     Palpations: Abdomen is soft.     Tenderness: There is no abdominal tenderness.  Musculoskeletal:     Cervical back: Normal range of motion.  Lymphadenopathy:     Cervical: No cervical adenopathy.  Skin:    General: Skin is warm and dry.  Neurological:     General: No focal deficit present.     Mental Status: He is alert and oriented to person, place, and time.  Psychiatric:        Mood and Affect: Mood normal.        Behavior: Behavior normal.       UC Treatments / Results  Labs (all labs ordered are listed, but only abnormal results are displayed) Labs Reviewed - No data to display  EKG   Radiology No results found.  Procedures Procedures (including critical care time)  Medications Ordered in UC Medications - No data to display  Initial Impression / Assessment and Plan /  UC Course  I have reviewed the triage vital signs and the nursing notes.  Pertinent labs & imaging results that were available during my care of the patient were reviewed by me and considered in my medical decision making (see chart for details).  On exam, lung sounds remain clear throughout, room air sats at 97%.  Do suspect patient has continued viral URI with cough.  As cough has remained persistent, will treat with prednisone 40 mg for the next 5 days, and Promethazine DM which will also help with postnasal drip.  Supportive care recommendations were provided and discussed with the patient to include fluids, rest, and use of a humidifier during sleep.  Discussed indications with the patient regarding when follow-up be necessary.  Patient was in agreement with this plan of care and verbalized understanding.  All questions were answered.  Patient stable for discharge.  Work note was provided.   Final Clinical Impressions(s) / UC Diagnoses   Final diagnoses:  Viral URI with cough     Discharge Instructions      Take medication as prescribed.  Continue use of the nasal spray you are currently using. Increase fluids and allow for plenty of rest. Warm salt water gargles 3-4 times daily as needed for throat pain or discomfort. Recommend use of normal saline nasal spray throughout the day for nasal congestion and runny nose. For the cough, make sure you continue to sleep elevated and to use a humidifier in the bedroom at nighttime during sleep. If you continue to experience cough, but are feeling well generally, continue to increase fluids and use  cough drops as needed.  If you continue to experience cough with new symptoms of fever, shortness of breath, difficulty breathing, wheezing, or other concerns, please follow-up in this clinic or in the emergency department for further evaluation. Follow-up as needed.     ED Prescriptions     Medication Sig Dispense Auth. Provider   brompheniramine-pseudoephedrine-DM 30-2-10 MG/5ML syrup Take 5 mLs by mouth 4 (four) times daily as needed. 140 mL Leath-Warren, Sadie Haber, NP   predniSONE (DELTASONE) 20 MG tablet Take 2 tablets (40 mg total) by mouth daily with breakfast for 5 days. 10 tablet Leath-Warren, Sadie Haber, NP      PDMP not reviewed this encounter.   Abran Cantor, NP 07/15/23 1024

## 2023-07-15 NOTE — Discharge Instructions (Signed)
Take medication as prescribed.  Continue use of the nasal spray you are currently using. Increase fluids and allow for plenty of rest. Warm salt water gargles 3-4 times daily as needed for throat pain or discomfort. Recommend use of normal saline nasal spray throughout the day for nasal congestion and runny nose. For the cough, make sure you continue to sleep elevated and to use a humidifier in the bedroom at nighttime during sleep. If you continue to experience cough, but are feeling well generally, continue to increase fluids and use cough drops as needed.  If you continue to experience cough with new symptoms of fever, shortness of breath, difficulty breathing, wheezing, or other concerns, please follow-up in this clinic or in the emergency department for further evaluation. Follow-up as needed.

## 2023-07-15 NOTE — ED Triage Notes (Signed)
Pt reports was seen for similar last week and reports increase in congestion, productive cough at night.

## 2024-02-08 ENCOUNTER — Other Ambulatory Visit: Payer: Self-pay

## 2024-02-08 ENCOUNTER — Encounter: Payer: Self-pay | Admitting: Emergency Medicine

## 2024-02-08 ENCOUNTER — Ambulatory Visit
Admission: EM | Admit: 2024-02-08 | Discharge: 2024-02-08 | Disposition: A | Discharge status: Home / Self Care | Attending: Family Medicine | Admitting: Family Medicine

## 2024-02-08 DIAGNOSIS — R0789 Other chest pain: Secondary | ICD-10-CM

## 2024-02-08 DIAGNOSIS — F419 Anxiety disorder, unspecified: Secondary | ICD-10-CM | POA: Diagnosis not present

## 2024-02-08 DIAGNOSIS — R112 Nausea with vomiting, unspecified: Secondary | ICD-10-CM | POA: Diagnosis not present

## 2024-02-08 MED ORDER — ONDANSETRON 4 MG PO TBDP
4.0000 mg | ORAL_TABLET | Freq: Three times a day (TID) | ORAL | 0 refills | Status: AC | PRN
Start: 2024-02-08 — End: ?

## 2024-02-08 NOTE — Discharge Instructions (Signed)
 Your exam, vital signs and EKG are very reassuring.  Your labs should come back tomorrow and we will let you know if anything comes back abnormal.  I have prescribed more Zofran  for as needed use for the nausea episodes.  Follow-up with your primary care provider for a recheck

## 2024-02-08 NOTE — ED Triage Notes (Addendum)
 Pt reports emesis,not feeling right since Saturday. Pt reports intermittent shortness of breath since Sunday. Is concerned for possible anxiety attacks. Reports family hx of similar. NAD noted. Denies pain at present.

## 2024-02-08 NOTE — ED Provider Notes (Signed)
 [facility]    CSN: [id number] Arrival date & time: 02/08/24  9187      History   Chief Complaint Chief Complaint  Patient presents with   Emesis    HPI [name] [name] is a 33 y.o. male.   Patient presenting today with about 4 days of intermittent brief episodes of nausea, vomiting, and feelings that he equates to possibly panic episodes where he gets some chest tightness and just feels anxious and not right.  Denies fever, chills, chest pain, abdominal pain, shortness of breath, diarrhea, upper respiratory symptoms.  No past history of similar.  Has taken some old Zofran  here and there with good relief of symptoms.    Past Medical History:  Diagnosis Date   Medical history non-contributory     Patient Active Problem List   Diagnosis Date Noted   Empyema, left (HCC) 08/06/2013   Sepsis due to Streptococcus, group A (HCC) 08/06/2013   Sepsis (HCC) 08/06/2013   ARDS (adult respiratory distress syndrome) (HCC) 08/05/2013   Acute respiratory failure with hypoxia (HCC) 08/05/2013   Protein-calorie malnutrition, severe (HCC) 08/05/2013   Septic shock (HCC) 08/04/2013   Chest wall abscess 08/03/2013   Pain 08/03/2013    Past Surgical History:  Procedure Laterality Date   APPLICATION OF WOUND VAC Left 08/04/2013   Procedure: 2 Ply APPLICATION OF WOUND VAC;  Surgeon: Maude Fleeta Ochoa, MD;  Location: [facility];  Service: Thoracic;  Laterality: Left;   NO PAST SURGERIES     STERNAL WOUND DEBRIDEMENT N/A 08/04/2013   Procedure: CHEST WALL ABCESS DEBRIDEMENT;  Surgeon: Maude Fleeta Ochoa, MD;  Location: [facility];  Service: Thoracic;  Laterality: N/A;   VIDEO ASSISTED THORACOSCOPY (VATS)/EMPYEMA Left 08/06/2013   Procedure: VIDEO ASSISTED THORACOSCOPY (VATS)/EMPYEMA;  Surgeon: Sudie VEAR Laine, MD;  Location: [facility];  Service: Thoracic;  Laterality: Left;       Home Medications    Prior to Admission medications   Medication Sig Start Date End Date Taking? Authorizing Provider   ondansetron  (ZOFRAN -ODT) 4 MG disintegrating tablet Take 1 tablet (4 mg total) by mouth every 8 (eight) hours as needed for nausea or vomiting. 02/08/24  Yes Stuart Vernell Norris, PA-C  brompheniramine-pseudoephedrine-DM 30-2-10 MG/5ML syrup Take 5 mLs by mouth 4 (four) times daily as needed. 07/15/23   Leath-Warren, Etta PARAS, NP  fluticasone  (FLONASE ) 50 MCG/ACT nasal spray Place 2 sprays into both nostrils daily. 07/06/23   Leath-Warren, Etta PARAS, NP  magic mouthwash (nystatin , hydrocortisone , diphenhydrAMINE , lidocaine ) suspension Swish and swallow 5 mLs 4 (four) times daily as needed for mouth pain. 07/06/23   Leath-Warren, Etta PARAS, NP    Family History History reviewed. No pertinent family history.  Social History Social History   Tobacco Use   Smoking status: Never   Smokeless tobacco: Never  Substance Use Topics   Alcohol use: No   Drug use: No     Allergies   Patient has no known allergies.   Review of Systems Review of Systems PER HPI  Physical Exam Triage Vital Signs ED Triage Vitals [02/08/24 0830]  Encounter Vitals Group     BP 123/83     Girls Systolic BP Percentile      Girls Diastolic BP Percentile      Boys Systolic BP Percentile      Boys Diastolic BP Percentile      Pulse Rate 78     Resp 20     Temp 98.2 F (36.8 C)     Temp  Source Oral     SpO2 98 %     Weight      Height      Head Circumference      Peak Flow      Pain Score 0     Pain Loc      Pain Education      Exclude from Growth Chart    No data found.  Updated Vital Signs BP 123/83 (BP Location: Right Arm)   Pulse 78   Temp 98.2 F (36.8 C) (Oral)   Resp 20   SpO2 98%   Visual Acuity Right Eye Distance:   Left Eye Distance:   Bilateral Distance:    Right Eye Near:   Left Eye Near:    Bilateral Near:     Physical Exam Vitals and nursing note reviewed.  Constitutional:      Appearance: Normal appearance.  HENT:     Head: Atraumatic.     Mouth/Throat:      Mouth: Mucous membranes are moist.     Pharynx: Oropharynx is clear.  Eyes:     Extraocular Movements: Extraocular movements intact.     Conjunctiva/sclera: Conjunctivae normal.  Cardiovascular:     Rate and Rhythm: Normal rate and regular rhythm.  Pulmonary:     Effort: Pulmonary effort is normal.     Breath sounds: Normal breath sounds.  Abdominal:     General: Bowel sounds are normal. There is no distension.     Palpations: Abdomen is soft.     Tenderness: There is no abdominal tenderness. There is no right CVA tenderness, left CVA tenderness or guarding.  Musculoskeletal:        General: Normal range of motion.     Cervical back: Normal range of motion and neck supple.  Skin:    General: Skin is warm and dry.  Neurological:     General: No focal deficit present.     Mental Status: He is oriented to person, place, and time.  Psychiatric:        Mood and Affect: Mood normal.        Thought Content: Thought content normal.        Judgment: Judgment normal.      UC Treatments / Results  Labs (all labs ordered are listed, but only abnormal results are displayed) Labs Reviewed  COMPREHENSIVE METABOLIC PANEL WITH GFR  CBC WITH DIFFERENTIAL/PLATELET  TSH    EKG   Radiology No results found.  Procedures Procedures (including critical care time)  Medications Ordered in UC Medications - No data to display  Initial Impression / Assessment and Plan / UC Course  I have reviewed the triage vital signs and the nursing notes.  Pertinent labs & imaging results that were available during my care of the patient were reviewed by me and considered in my medical decision making (see chart for details).     Vital signs and exam very reassuring today with no obvious abnormalities.  EKG today showing sinus bradycardia at 53 bpm with no acute ST or T wave changes.  Labs pending for further evaluation.  Unclear etiology of symptoms, possibly viral versus new panic attacks.  Will  treat with Zofran  as needed, brat diet, fluids, rest and return precautions reviewed. Final Clinical Impressions(s) / UC Diagnoses   Final diagnoses:  Chest tightness  Anxiousness  Nausea and vomiting, unspecified vomiting type     Discharge Instructions      Your exam, vital signs and EKG are  very reassuring.  Your labs should come back tomorrow and we will let you know if anything comes back abnormal.  I have prescribed more Zofran  for as needed use for the nausea episodes.  Follow-up with your primary care provider for a recheck    ED Prescriptions     Medication Sig Dispense Auth. Provider   ondansetron  (ZOFRAN -ODT) 4 MG disintegrating tablet Take 1 tablet (4 mg total) by mouth every 8 (eight) hours as needed for nausea or vomiting. 20 tablet Stuart Vernell Norris, [place]      PDMP not reviewed this encounter.   Stuart Vernell Norris, [place] 02/08/24 [phone]

## 2024-02-09 ENCOUNTER — Ambulatory Visit (HOSPITAL_COMMUNITY): Payer: Self-pay

## 2024-02-09 LAB — COMPREHENSIVE METABOLIC PANEL WITH GFR
ALT: 12 IU/L (ref 0–44)
AST: 12 IU/L (ref 0–40)
Albumin: 4.8 g/dL (ref 4.3–5.2)
Alkaline Phosphatase: 81 IU/L (ref 44–121)
BUN/Creatinine Ratio: 13 (ref 9–20)
BUN: 12 mg/dL (ref 6–20)
Bilirubin Total: 0.7 mg/dL (ref 0.0–1.2)
CO2: 21 mmol/L (ref 20–29)
Calcium: 9.3 mg/dL (ref 8.7–10.2)
Chloride: 105 mmol/L (ref 96–106)
Creatinine, Ser: 0.94 mg/dL (ref 0.76–1.27)
Globulin, Total: 2.4 g/dL (ref 1.5–4.5)
Glucose: 94 mg/dL (ref 70–99)
Potassium: 4.6 mmol/L (ref 3.5–5.2)
Sodium: 141 mmol/L (ref 134–144)
Total Protein: 7.2 g/dL (ref 6.0–8.5)
eGFR: 112 mL/min/1.73 (ref >59)

## 2024-02-09 LAB — CBC WITH DIFFERENTIAL/PLATELET
Basophils Absolute: 0 x10E3/uL (ref 0.0–0.2)
Basos: 0 %
EOS (ABSOLUTE): 0.1 x10E3/uL (ref 0.0–0.4)
Eos: 2 %
Hematocrit: 44.3 % (ref 37.5–51.0)
Hemoglobin: 14.6 g/dL (ref 13.0–17.7)
Immature Grans (Abs): 0 x10E3/uL (ref 0.0–0.1)
Immature Granulocytes: 0 %
Lymphocytes Absolute: 1.4 x10E3/uL (ref 0.7–3.1)
Lymphs: 28 %
MCH: 30 pg (ref 26.6–33.0)
MCHC: 33 g/dL (ref 31.5–35.7)
MCV: 91 fL (ref 79–97)
Monocytes Absolute: 0.3 x10E3/uL (ref 0.1–0.9)
Monocytes: 6 %
Neutrophils Absolute: 3.1 x10E3/uL (ref 1.4–7.0)
Neutrophils: 64 %
Platelets: 215 x10E3/uL (ref 150–450)
RBC: 4.86 x10E6/uL (ref 4.14–5.80)
RDW: 12.6 % (ref 11.6–15.4)
WBC: 4.8 x10E3/uL (ref 3.4–10.8)

## 2024-02-09 LAB — TSH: TSH: 1.25 u[IU]/mL (ref 0.450–4.500)
